# Patient Record
Sex: Female | Born: 1990 | Race: Black or African American | Hispanic: No | Marital: Single | Smoking: Never smoker
Health system: Southern US, Community
[De-identification: ages and names within clinical notes are randomized; demographics above are authoritative.]

## PROBLEM LIST (undated history)

## (undated) DIAGNOSIS — Z982 Presence of cerebrospinal fluid drainage device: Secondary | ICD-10-CM

## (undated) DIAGNOSIS — R569 Unspecified convulsions: Secondary | ICD-10-CM

## (undated) HISTORY — DX: Presence of cerebrospinal fluid drainage device: Z98.2

## (undated) HISTORY — PX: VENTRICULOPERITONEAL SHUNT: SHX204

## (undated) HISTORY — PX: APPENDECTOMY: SHX54

## (undated) HISTORY — DX: Unspecified convulsions: R56.9

---
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
## 2012-03-11 ENCOUNTER — Telehealth: Payer: Self-pay | Admitting: *Deleted

## 2012-03-11 NOTE — Telephone Encounter (Signed)
Pt left message requesting to know when she is due for her last Gardasil injection.  I returned her call and informed her that last injection is due currently and must be given by 03/27/12 for the series to have been completed properly. Pt voiced understanding and stated that she will call back to schedule appt.

## 2012-04-10 ENCOUNTER — Ambulatory Visit: Payer: BC Managed Care – PPO | Admitting: Advanced Practice Midwife

## 2016-03-11 ENCOUNTER — Encounter (HOSPITAL_BASED_OUTPATIENT_CLINIC_OR_DEPARTMENT_OTHER): Payer: Self-pay | Admitting: Emergency Medicine

## 2016-03-11 DIAGNOSIS — R1030 Lower abdominal pain, unspecified: Secondary | ICD-10-CM | POA: Insufficient documentation

## 2016-03-11 LAB — URINALYSIS, ROUTINE W REFLEX MICROSCOPIC
BILIRUBIN URINE: NEGATIVE
Glucose, UA: NEGATIVE mg/dL
HGB URINE DIPSTICK: NEGATIVE
KETONES UR: NEGATIVE mg/dL
Nitrite: NEGATIVE
PH: 6 (ref 5.0–8.0)
Protein, ur: NEGATIVE mg/dL
SPECIFIC GRAVITY, URINE: 1.027 (ref 1.005–1.030)

## 2016-03-11 LAB — PREGNANCY, URINE: Preg Test, Ur: NEGATIVE

## 2016-03-11 LAB — URINE MICROSCOPIC-ADD ON

## 2016-03-11 NOTE — ED Triage Notes (Signed)
Pt in c/o abd pressure x 1 week, mild pain. Denies vaginal bleeding but endorses discharge. Pt alert, interactive, ambulatory in NAD.

## 2016-03-12 ENCOUNTER — Emergency Department (HOSPITAL_BASED_OUTPATIENT_CLINIC_OR_DEPARTMENT_OTHER)
Admission: EM | Admit: 2016-03-12 | Discharge: 2016-03-12 | Disposition: A | Discharge status: Home / Self Care | Payer: Self-pay | Attending: Emergency Medicine | Admitting: Emergency Medicine

## 2016-03-12 DIAGNOSIS — R1030 Lower abdominal pain, unspecified: Secondary | ICD-10-CM

## 2016-03-12 LAB — WET PREP, GENITAL
Clue Cells Wet Prep HPF POC: NONE SEEN
Sperm: NONE SEEN
Trich, Wet Prep: NONE SEEN
YEAST WET PREP: NONE SEEN

## 2016-03-12 MED ORDER — METRONIDAZOLE 500 MG PO TABS
500.0000 mg | ORAL_TABLET | Freq: Once | ORAL | Status: AC
  Administered 2016-03-12: 500 mg via ORAL
  Filled 2016-03-12: qty 1

## 2016-03-12 MED ORDER — METRONIDAZOLE 500 MG PO TABS: 500.0000 mg | ORAL_TABLET | Freq: Two times a day (BID) | ORAL | 0 refills | Status: DC

## 2016-03-12 NOTE — Discharge Instructions (Signed)
You have been seen in the Emergency Department (ED) for abdominal pain.  Your evaluation did not identify a clear cause of your symptoms but was generally reassuring.  We are starting you on an antibiotic for presumed vaginal infection. Take as directed.   Please follow up as instructed above regarding today?s emergent visit and the symptoms that are bothering you.  Return to the ED if your abdominal pain worsens or fails to improve, you develop bloody vomiting, bloody diarrhea, you are unable to tolerate fluids due to vomiting, fever greater than 101, or other symptoms that concern you.

## 2016-03-12 NOTE — ED Provider Notes (Signed)
Emergency Department Provider Note   I have reviewed the triage vital signs and the nursing notes.   HISTORY  Chief Complaint Abdominal Pain   HPI Diana Henson is a 25 y.o. female with PMH of VP shunt presents to the emergency department for evaluation of lower abdominal pain worsening over the last week. She denies any obvious exacerbating or alleviating factors. She notes some associated mild vaginal discharge but no bleeding. Patient states that symptoms are worsening slightly. No associated fevers. No upper abdominal pain or back pain. No dysuria, hesitancy, urgency. She denies any associated chest pain or difficulty breathing. There are no exacerbating or alleviating factors.    Past Medical History:  Diagnosis Date  . 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 Asymptomatic bacteriuria 11/05/2011    Past Surgical History:  Procedure Laterality Date  . APPENDECTOMY    . VENTRICULOPERITONEAL SHUNT     multiple surgeries since birth    Current Outpatient Rx  . Order #: [id number] Class: Normal  . Order #: [id number] Class: Print  . Order #: [id number]: [id number] Class: Historical Med    Allergies Review of patient's allergies indicates no known allergies.  Family History  Problem Relation Age of Onset  . Diabetes Maternal Grandfather   . Hypertension Maternal Grandfather   . Kidney disease Maternal Grandfather     Social History Social History  Substance Use Topics  . Smoking status: Never Smoker  . Smokeless tobacco: Never Used  . Alcohol use No    Review of Systems  Constitutional: No fever/chills Eyes: No visual changes. ENT: No sore throat. Cardiovascular: Denies chest pain. Respiratory: Denies shortness of breath. Gastrointestinal: Positive lower abdominal pain. No nausea, no vomiting. No diarrhea. No constipation. Genitourinary: Negative for dysuria. Positive  vaginal discharge.  Musculoskeletal: Negative for back pain. Skin: Negative for rash. Neurological: Negative for headaches, focal weakness or numbness.  10-point ROS otherwise negative.  ____________________________________________   PHYSICAL EXAM:  VITAL SIGNS: ED Triage Vitals  Enc Vitals Group     BP 03/11/16 2219 124/83     Pulse Rate 03/11/16 2219 91     Resp 03/11/16 2219 18     Temp 03/11/16 2219 98.6 F (37 C)     SpO2 03/11/16 2219 99 %     Weight 03/11/16 2219 170 lb (77.1 kg)     Height 03/11/16 2219 5\' 5"  (1.651 m)     Pain Score 03/11/16 2217 6   Constitutional: Alert and oriented. Well appearing and in no acute distress. Eyes: Conjunctivae are normal.  Head: Atraumatic. Nose: No congestion/rhinnorhea. Mouth/Throat: Mucous membranes are moist.  Oropharynx non-erythematous. Neck: No stridor.  Cardiovascular: Normal rate, regular rhythm. Good peripheral circulation. Grossly normal heart sounds.   Respiratory: Normal respiratory effort.  No retractions. Lungs CTAB. Gastrointestinal: Soft and nontender. No distention.  Genitourinary: Moderate creamy vaginal discharge. No blood. Normal appearing cervix. No CMT. Mild right adnexal tenderness. No  Musculoskeletal: No lower extremity tenderness nor edema. No gross deformities of extremities. Neurologic:  Normal speech and language. No gross focal neurologic deficits are appreciated.  Skin:  Skin is warm, dry and intact. No rash noted.   ____________________________________________   LABS (all labs ordered are listed, but only abnormal results are displayed)  Labs Reviewed  WET PREP, GENITAL - Abnormal; Notable for the following:       Result Value  WBC, Wet Prep HPF POC MANY (*)    All other components within normal limits  URINALYSIS, ROUTINE W REFLEX MICROSCOPIC (NOT AT [facility]) - Abnormal; Notable for the following:    APPearance CLOUDY (*)    Leukocytes, UA SMALL (*)    All other components within normal  limits  URINE MICROSCOPIC-ADD ON - Abnormal; Notable for the following:    Squamous Epithelial / LPF 6-30 (*)    Bacteria, UA MANY (*)    All other components within normal limits  PREGNANCY, URINE  GC/CHLAMYDIA PROBE AMP ([place]) NOT AT [facility]   ____________________________________________  RADIOLOGY  None ____________________________________________   PROCEDURES  Procedure(s) performed:   Procedures  None ____________________________________________   INITIAL IMPRESSION / ASSESSMENT AND PLAN / ED COURSE  Pertinent labs & imaging results that were available during my care of the patient were reviewed by me and considered in my medical decision making (see chart for details).  Patient resents emergency department for evaluation of intermittent lower abdominal discomfort. Differential at this time is broad. Abdomen is soft and nontender anteriorly. Pelvic exam is significant for some very mild right adnexal tenderness. No cervical motion tenderness. She does have moderate amount of discharge.   Differential diagnosis includes but is not exclusive to ectopic pregnancy, ovarian cyst, ovarian torsion, acute appendicitis, urinary tract infection, endometriosis, bowel obstruction, hernia, colitis, renal colic, gastroenteritis, volvulus etc.  Equivocal UA. No clear clinical history to suggest ovarian torsion. No clinical concern for bowel obstruction or hernia. Plan for treatment with metronidazole for 7 days for presumed bacterial vaginosis and reassessment by patient's primary care physician. Also gave contact information for women's clinic in [place] for Gyn follow up.   At this time, I do not feel there is any life-threatening condition present. I have reviewed and discussed all results (EKG, imaging, lab, urine as appropriate), exam findings with patient. I have reviewed nursing notes and appropriate previous records.  I feel the patient is safe to be discharged home without  further emergent workup. Discussed usual and customary return precautions. Patient and family (if present) verbalize understanding and are comfortable with this plan.  Patient will follow-up with their primary care provider. If they do not have a primary care provider, information for follow-up has been provided to them. All questions have been answered.    ____________________________________________  FINAL CLINICAL IMPRESSION(S) / ED DIAGNOSES  Final diagnoses:  Lower abdominal pain     MEDICATIONS GIVEN DURING THIS VISIT:  Medications  metroNIDAZOLE (FLAGYL) tablet 500 mg (500 mg Oral Given 03/12/16 0417)     NEW OUTPATIENT MEDICATIONS STARTED DURING THIS VISIT:  Discharge Medication List as of 03/12/2016  3:52 AM    START taking these medications   Details  metroNIDAZOLE (FLAGYL) 500 MG tablet Take 1 tablet (500 mg total) by mouth 2 (two) times daily., Starting Sun 03/12/2016, Print          Note:  This document was prepared using Dragon voice recognition software and may include unintentional dictation errors.  Alona Bene, MD Emergency Medicine   Maia Plan, MD 03/12/16 [phone]

## 2016-03-12 NOTE — ED Notes (Signed)
Pt alert, c/o of abdominal pain for 1 week, pt denies fever, nausea and vomiting.

## 2016-03-12 NOTE — ED Notes (Signed)
Pt alert, NAD, calm, interactive, resps e/u, speaking in clear complete sentences, admits to continued pain, (denies: nausea, sob or dizziness), updated. EDP into room for pelvic exam.

## 2016-03-13 LAB — GC/CHLAMYDIA PROBE AMP (~~LOC~~) NOT AT ARMC
Chlamydia: NEGATIVE
Neisseria Gonorrhea: NEGATIVE

## 2017-01-31 ENCOUNTER — Emergency Department (HOSPITAL_BASED_OUTPATIENT_CLINIC_OR_DEPARTMENT_OTHER): Payer: Self-pay

## 2017-01-31 ENCOUNTER — Emergency Department (HOSPITAL_BASED_OUTPATIENT_CLINIC_OR_DEPARTMENT_OTHER)
Admission: EM | Admit: 2017-01-31 | Discharge: 2017-02-01 | Disposition: A | Discharge status: Home / Self Care | Payer: Self-pay | Attending: Emergency Medicine | Admitting: Emergency Medicine

## 2017-01-31 ENCOUNTER — Encounter (HOSPITAL_BASED_OUTPATIENT_CLINIC_OR_DEPARTMENT_OTHER): Payer: Self-pay

## 2017-01-31 DIAGNOSIS — N3000 Acute cystitis without hematuria: Secondary | ICD-10-CM | POA: Insufficient documentation

## 2017-01-31 DIAGNOSIS — R569 Unspecified convulsions: Secondary | ICD-10-CM | POA: Insufficient documentation

## 2017-01-31 DIAGNOSIS — Z79899 Other long term (current) drug therapy: Secondary | ICD-10-CM | POA: Insufficient documentation

## 2017-01-31 DIAGNOSIS — G40909 Epilepsy, unspecified, not intractable, without status epilepticus: Secondary | ICD-10-CM

## 2017-01-31 NOTE — ED Triage Notes (Signed)
C/o seizure x 2 this pm-hx of same-states she is compliant with meds-NAD-steady gait

## 2017-02-01 ENCOUNTER — Emergency Department (HOSPITAL_BASED_OUTPATIENT_CLINIC_OR_DEPARTMENT_OTHER): Payer: Self-pay

## 2017-02-01 ENCOUNTER — Encounter (HOSPITAL_BASED_OUTPATIENT_CLINIC_OR_DEPARTMENT_OTHER): Payer: Self-pay | Admitting: Emergency Medicine

## 2017-02-01 LAB — URINALYSIS, ROUTINE W REFLEX MICROSCOPIC
Bilirubin Urine: NEGATIVE
GLUCOSE, UA: NEGATIVE mg/dL
KETONES UR: NEGATIVE mg/dL
NITRITE: NEGATIVE
PH: 7 (ref 5.0–8.0)
Protein, ur: NEGATIVE mg/dL
Specific Gravity, Urine: <1.005 — ABNORMAL LOW (ref 1.005–1.030)

## 2017-02-01 LAB — URINALYSIS, MICROSCOPIC (REFLEX)

## 2017-02-01 LAB — BASIC METABOLIC PANEL
Anion gap: 6 (ref 5–15)
BUN: 15 mg/dL (ref 6–20)
CHLORIDE: 110 mmol/L (ref 101–111)
CO2: 21 mmol/L — AB (ref 22–32)
CREATININE: 1.02 mg/dL — AB (ref 0.44–1.00)
Calcium: 9 mg/dL (ref 8.9–10.3)
GFR calc Af Amer: >60 mL/min (ref >60)
GFR calc non Af Amer: >60 mL/min (ref >60)
GLUCOSE: 107 mg/dL — AB (ref 65–99)
Potassium: 3.6 mmol/L (ref 3.5–5.1)
SODIUM: 137 mmol/L (ref 135–145)

## 2017-02-01 LAB — PREGNANCY, URINE: Preg Test, Ur: NEGATIVE

## 2017-02-01 MED ORDER — NITROFURANTOIN MONOHYD MACRO 100 MG PO CAPS
100.0000 mg | ORAL_CAPSULE | Freq: Once | ORAL | Status: AC
  Administered 2017-02-01: 100 mg via ORAL
  Filled 2017-02-01: qty 1

## 2017-02-01 MED ORDER — NITROFURANTOIN MONOHYD MACRO 100 MG PO CAPS: 100.0000 mg | ORAL_CAPSULE | Freq: Two times a day (BID) | ORAL | 0 refills | Status: DC

## 2017-02-01 NOTE — Discharge Instructions (Signed)
No driving until cleared by neurology

## 2017-02-01 NOTE — ED Provider Notes (Addendum)
[facility]   CSN: [id number] Arrival date & time: 01/31/17  2249     History   Chief Complaint Chief Complaint  Patient presents with  . Seizures    HPI Diana Henson is a 26 y.o. female.  The history is provided by the patient.  Seizures   This is a recurrent problem. The current episode started 6 to 12 hours ago. The problem has been resolved. The most recent episode lasted less than 30 seconds. Pertinent negatives include no sleepiness, no confusion, no headaches, no speech difficulty, no visual disturbance, no neck stiffness, no sore throat, no chest pain, no cough, no nausea, no vomiting, no diarrhea and no muscle weakness. Characteristics do not include bowel incontinence or rhythmic jerking. The episode was witnessed. There was no sensation of an aura present. The seizures did not continue in the ED. The seizure(s) had no focality. Possible causes do not include missed seizure meds or recent illness. There has been no fever. There were no medications administered prior to arrival.  Patient changed from Keppra to Zonegran in Spring for her seizures.  Mom states they are brought on by stress and she lost her job today.  No LOC with her seizures no bowel or bladder incontinence.   Past Medical History:  Diagnosis Date  . 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 Asymptomatic bacteriuria 11/05/2011    Past Surgical History:  Procedure Laterality Date  . APPENDECTOMY    . VENTRICULOPERITONEAL SHUNT     multiple surgeries since birth    OB History    Gravida Para Term Preterm AB Living   0 0 0 0 0 0   SAB TAB Ectopic Multiple Live Births   0 0 0 0         Home Medications    Prior to Admission medications   Medication Sig Start Date End Date Taking? Authorizing Provider  zonisamide (ZONEGRAN) 100 MG capsule Take 100 mg by mouth daily.    Yes [provider]    Family History Family History  Problem Relation Age of Onset  . Diabetes Maternal Grandfather   . Hypertension Maternal Grandfather   . Kidney disease Maternal Grandfather     Social History Social History  Substance Use Topics  . Smoking status: Never Smoker  . Smokeless tobacco: Never Used  . Alcohol use No     Allergies   Patient has no known allergies.   Review of Systems Review of Systems  Constitutional: Negative for fever.  HENT: Negative for sore throat.   Eyes: Negative for visual disturbance.  Respiratory: Negative for cough.   Cardiovascular: Negative for chest pain.  Gastrointestinal: Negative for bowel incontinence, diarrhea, nausea and vomiting.  Genitourinary: Negative for dysuria and hematuria.  Musculoskeletal: Negative for neck pain and neck stiffness.  Skin: Negative for rash.  Neurological: Positive for seizures. Negative for dizziness, syncope, facial asymmetry, speech difficulty and headaches.  Psychiatric/Behavioral: Negative for confusion.  All other systems reviewed and are negative.    Physical Exam Updated Vital Signs BP 114/76 (BP Location: Right Arm)   Pulse 75   Temp 98.2 F (36.8 C) (Oral)   Resp 18   Ht  (1.626 m)   Wt 78.9 kg (174 lb)   LMP 01/22/2017   SpO2 100%   BMI 29.87 kg/m  Physical Exam  Constitutional: She is oriented to person, place, and time. She appears well-developed and well-nourished. No distress.  HENT:  Head: Normocephalic and atraumatic.  Right Ear: External ear normal.  Left Ear: External ear normal.  Nose: Nose normal.  Mouth/Throat: Oropharynx is clear and moist. No oropharyngeal exudate.  Eyes: Pupils are equal, round, and reactive to light. Conjunctivae and EOM are normal.  Neck: Normal range of motion. Neck supple.  Cardiovascular: Normal rate, regular rhythm, normal heart sounds and intact distal pulses.   Pulmonary/Chest: Effort normal and breath sounds  normal. No stridor. She has no wheezes. She has no rales.  Abdominal: Soft. Bowel sounds are normal. She exhibits no mass. There is no tenderness. There is no rebound and no guarding.  Musculoskeletal: Normal range of motion. She exhibits no deformity.  Lymphadenopathy:    She has no cervical adenopathy.  Neurological: She is alert and oriented to person, place, and time. No cranial nerve deficit. She exhibits normal muscle tone. Coordination normal.  Skin: Skin is warm. Capillary refill takes less than 2 seconds.  Psychiatric: She has a normal mood and affect.     ED Treatments / Results   Vitals:   01/31/17 2258 02/01/17 0110  BP: 128/77 114/76  Pulse: 91 75  Resp: 16 18  Temp: 98.2 F (36.8 C)   SpO2: 100% 100%      Radiology Dg Skull 1-3 Views  Result Date: 02/01/2017 CLINICAL DATA:  Seizure x2 tonight in patient with a ventriculostomy shunt catheter. Patient status post catheter revision in February, 2014 EXAM: SKULL - 1-3 VIEW COMPARISON:  None. FINDINGS: There is no evidence of skull fracture or other focal bone lesions. Ventriculostomy shunt catheter from a right parietal approach is intact. IMPRESSION: Intact shunt catheter. Electronically Signed   By: Drusilla Kannerhomas  Dalessio M.D.   On: 02/01/2017 02:17   Dg Neck Soft Tissue  Result Date: 02/01/2017 CLINICAL DATA:  Status post seizure times two tonight in patient with a ventriculostomy shunt catheter. EXAM: NECK SOFT TISSUES - 1+ VIEW COMPARISON:  None. FINDINGS: Ventriculostomy shunt catheter is intact. The cervical airway is unremarkable and no radio-opaque foreign body identified. IMPRESSION: Intact shunt catheter.  Negative exam. Electronically Signed   By: Drusilla Kannerhomas  Dalessio M.D.   On: 02/01/2017 02:21   Dg Chest 1 View  Result Date: 02/01/2017 CLINICAL DATA:  Status post seizure times two tonight in patient with a ventriculostomy shunt catheter. EXAM: CHEST 1 VIEW COMPARISON:  PA and lateral chest 12/07/2009. FINDINGS: Shunt  catheter coursing on the right side of the chest is intact. Lungs are clear. Heart size is normal. No pneumothorax or pleural effusion. No bony abnormality. IMPRESSION: Intact shunt catheter.  No acute abnormality. Electronically Signed   By: Drusilla Kannerhomas  Dalessio M.D.   On: 02/01/2017 02:19   Dg Abd 1 View  Result Date: 02/01/2017 CLINICAL DATA:  Status post seizure times two tonight in patient with a ventriculostomy shunt catheter. EXAM: ABDOMEN - 1 VIEW COMPARISON:  None. FINDINGS: Ventriculostomy shunt catheter is intact with its tip in the left pelvis. The bowel gas pattern is normal. No radio-opaque calculi or other significant radiographic abnormality are seen. IMPRESSION: Intact shunt catheter.  Negative exam. Electronically Signed   By: Drusilla Kannerhomas  Dalessio M.D.   On: 02/01/2017 02:20   Ct Head Wo Contrast  Result Date: 01/31/2017 CLINICAL DATA:  Seizures x2 this evening. EXAM: CT HEAD WITHOUT CONTRAST TECHNIQUE: Contiguous axial images were obtained from the base of the skull through the  vertex without intravenous contrast. COMPARISON:  CT report from 10/11/2011 FINDINGS: Brain: No evidence of acute infarction, hemorrhage, hydrocephalus, extra-axial collection or mass lesion/mass effect. Vascular: No hyperdense vessel or unexpected calcification. Skull: Right-sided ventriculostomy is noted with tip in the region of the right frontal horn. No skull fracture. Sinuses/Orbits: No acute finding. Other: None IMPRESSION: No acute intracranial abnormality. Electronically Signed   By: Tollie Eth M.D.   On: 01/31/2017 23:32    Procedures Procedures (including critical care time)  Medications Ordered in ED Medications  nitrofurantoin (macrocrystal-monohydrate) (MACROBID) capsule 100 mg (not administered)      Final Clinical Impressions(s) / ED Diagnoses  Records from Neurologist reviewed.  Patient does have a seizure disorder.  Will treat for UTI.  Call your neurologist in the am to discuss dosage of  your zonegran or adding a second medication for your seizures.  No shunt malfunction.  Well appearing.  Stable for discharge with strict return precautions.  No driving until cleared by neurology.  The patient is very well appearing and has been observed in the ED.  Strict return precautions given for  chest pain, dyspnea on exertion, new weakness or numbness changes in vision or speech,  Inability to tolerate liquids or food, changes in voice cough, altered mental status or any concerns. No signs of systemic illness or infection. Return for recurrent seizures.  The patient is nontoxic-appearing on exam and vital signs are within normal limits.    I have reviewed the triage vital signs and the nursing notes. Pertinent labs &imaging results that were available during my care of the patient were reviewed by me and considered in my medical decision making (see chart for details).  After history, exam, and medical workup I feel the patient has been appropriately medically screened and is safe for discharge home. Pertinent diagnoses were discussed with the patient. Patient was given return precautions.       Toneisha Savary, MD 02/01/17 1610    Cy Blamer, MD 02/01/17 [id number]

## 2018-12-09 ENCOUNTER — Ambulatory Visit (INDEPENDENT_AMBULATORY_CARE_PROVIDER_SITE_OTHER): Payer: 59 | Admitting: Pediatrics

## 2018-12-09 ENCOUNTER — Other Ambulatory Visit: Payer: Self-pay

## 2018-12-09 ENCOUNTER — Encounter: Payer: Self-pay | Admitting: Pediatrics

## 2018-12-09 VITALS — BP 108/64 | HR 90 | Temp 98.2°F | Resp 16 | Ht 65.0 in | Wt 188.0 lb

## 2018-12-09 DIAGNOSIS — L500 Allergic urticaria: Secondary | ICD-10-CM

## 2018-12-09 DIAGNOSIS — J301 Allergic rhinitis due to pollen: Secondary | ICD-10-CM | POA: Diagnosis not present

## 2018-12-09 DIAGNOSIS — J4530 Mild persistent asthma, uncomplicated: Secondary | ICD-10-CM | POA: Diagnosis not present

## 2018-12-09 DIAGNOSIS — Z8669 Personal history of other diseases of the nervous system and sense organs: Secondary | ICD-10-CM

## 2018-12-09 DIAGNOSIS — Z982 Presence of cerebrospinal fluid drainage device: Secondary | ICD-10-CM

## 2018-12-09 MED ORDER — FLUTICASONE PROPIONATE 50 MCG/ACT NA SUSP: 2.0000 | Freq: Every day | NASAL | 5 refills | Status: DC | PRN

## 2018-12-09 MED ORDER — MONTELUKAST SODIUM 10 MG PO TABS: ORAL_TABLET | ORAL | 5 refills | Status: DC

## 2018-12-09 MED ORDER — ALBUTEROL SULFATE HFA 108 (90 BASE) MCG/ACT IN AERS: 2.0000 | INHALATION_SPRAY | RESPIRATORY_TRACT | 2 refills | Status: DC | PRN

## 2018-12-09 NOTE — Patient Instructions (Addendum)
   Environmental control of dust mite and mold Zyrtec 10 mg-take 1 tablet once a day for runny nose, itchy eyes or itching Fluticasone 2 sprays per nostril once a day if needed for stuffy nose Opcon-A-1 drop 3 times a day if needed for itchy eyes  Montelukast 10 mg-take 1 tablet once a day to prevent coughing or wheezing or tightness in the chest Pro-air 2 puffs every 4 hours if needed for wheezing or coughing spells.  You may use Pro-air 2 puffs 5 to 15 minutes before exercise Add prednisone 10 mg tablets to take 2 tablets twice a day for 3 days, 2 tablets on the fourth day, 1 tablet on the fifth day to bring your allergic symptoms under control Call us if you are not doing well on this treatment plan  Avoid fresh apples and strawberries.  I gave you a list of foods associated with oral pollinosis syndrome

## 2018-12-09 NOTE — Progress Notes (Addendum)
[address] [place] [address] Dept: [phone]  New Patient Note  Patient ID: Diana Henson, female    DOB: January 14, 1991  Age: 28 y.o. MRN: [id number] Date of Office Visit: 12/09/2018 Referring provider: No referring provider defined for this encounter.    Chief Complaint: Nasal Congestion, Allergic Rhinitis , and Pruritis  HPI [name] presents for evaluation of seasonal allergic rhinitis since childhood.  During the past 6 weeks she has had some tightness in the chest.  She has aggravation of her symptoms of sneezing and nasal congestion on exposure to dust, cigarette smoke, possibly cats and dogs and seasonal changes.  She does not have a history of asthma, eczema or gastroesophageal reflux.  She has had some itching of her skin for 3 months  Review of Systems  Constitutional: Negative.   HENT:       Seasonal allergic rhinitis since childhood  Eyes:       Itchy eyes at times  Respiratory:       Tightness in the chest for 6 weeks  Cardiovascular: Negative.   Gastrointestinal: Negative.   Genitourinary: Negative.   Musculoskeletal: Negative.   Skin:       Itchy skin for 3 months.  No actual hives  Neurological:       Migraine headaches.  VP shunt for hydrocephalus  Endo/Heme/Allergies:       No diabetes or thyroid disease  Psychiatric/Behavioral: Negative.     Outpatient Encounter Medications as of 12/09/2018  Medication Sig  . EMGALITY 120 MG/ML SOSY ADMINISTER 1 ML UNDER THE SKIN EVERY 30 DAYS  . zonisamide (ZONEGRAN) 100 MG capsule Take 200 mg by mouth daily.  Marland Kitchen albuterol (PROAIR HFA) 108 (90 Base) MCG/ACT inhaler Inhale 2 puffs into the lungs every 4 (four) hours as needed for wheezing or shortness of breath.  . fluticasone (FLONASE) 50 MCG/ACT nasal spray Place 2 sprays into both nostrils daily as needed (for stuffy nose).  . montelukast (SINGULAIR) 10 MG tablet Take 1 tablet once a day to prevent coughing or wheezing  . [DISCONTINUED]  nitrofurantoin, macrocrystal-monohydrate, (MACROBID) 100 MG capsule Take 1 capsule (100 mg total) by mouth 2 (two) times daily. X 7 days (Patient not taking: Reported on 12/09/2018)  . [DISCONTINUED] zonisamide (ZONEGRAN) 100 MG capsule Take 100 mg by mouth daily.  . [DISCONTINUED] medroxyPROGESTERone (DEPO-PROVERA) injection 150 mg    No facility-administered encounter medications on file as of 12/09/2018.      Drug Allergies:  No Known Allergies  Family History: Halie's family history includes Anemia in her mother and sister; Crohn's disease in her sister; Diabetes in her maternal grandfather; Hypertension in her maternal grandfather; Kidney disease in her maternal grandfather..  Family history is positive for asthma, hayfever, sinus problems, eczema, food allergies.  Family history is negative for angioedema, chronic urticaria lupus, chronic bronchitis or emphysema.  Social and environmental.  She is not exposed to cigarette smoking.  She has not smoked cigarettes in the past.. There are no pets in her home.  She visits her aunt who has 2 dogs.  She works doing Insurance underwriter work indoors  Physical Exam: BP 108/64 (BP Location: Left Arm, Patient Position: Sitting, Cuff Size: Normal)   Pulse 90   Temp 98.2 F (36.8 C) (Temporal)   Resp 16   Ht 5\' 5"  (1.651 m)   Wt 188 lb (85.3 kg)   SpO2 97%   BMI 31.28 kg/m    Physical Exam Constitutional:  Appearance: Normal appearance. She is obese.  HENT:     Head:     Comments: Eyes normal.  Ears normal.  Nose mild swelling of the nasal turbinates with a clear nasal discharge.  Pharynx normal. Neck:     Musculoskeletal: Neck supple.     Comments: No thyromegaly Cardiovascular:     Comments: S1-S2 normal no murmurs Pulmonary:     Comments: Clear to percussion and auscultation Abdominal:     Palpations: Abdomen is soft.     Tenderness: There is no abdominal tenderness.     Comments: No hepatosplenomegaly  Lymphadenopathy:     Cervical:  No cervical adenopathy.  Skin:    Comments: Clear  Neurological:     General: No focal deficit present.     Mental Status: She is alert and oriented to person, place, and time. Mental status is at baseline.  Psychiatric:        Mood and Affect: Mood normal.        Behavior: Behavior normal.        Thought Content: Thought content normal.        Judgment: Judgment normal.     Diagnostics: FVC 0.80 L FEV1 3.18 L.  Predicted FVC 3.36 L predicted FEV1 2.90 L.  After albuterol 2 puffs FVC 4.20 L FEV1 3.25 L-the spirometry is in the normal range and there was no significant improvement after albuterol  Allergy skin test were very positive to grass pollens, weed pollen, tree pollens, molds, cat.  There was some reactivity to dog, cockroach and dust mite.  Skin testing to foods was negative   Assessment  Assessment and Plan: 1. Seasonal allergic rhinitis due to pollen   2. Allergic urticaria   3. Mild persistent reactive airway disease without complication   4. History of migraine headaches   5. S/P VP shunt   6. History of hydrocephalus     Meds ordered this encounter  Medications  . fluticasone (FLONASE) 50 MCG/ACT nasal spray    Sig: Place 2 sprays into both nostrils daily as needed (for stuffy nose).    Dispense:  16 g    Refill:  5  . montelukast (SINGULAIR) 10 MG tablet    Sig: Take 1 tablet once a day to prevent coughing or wheezing    Dispense:  30 tablet    Refill:  5  . albuterol (PROAIR HFA) 108 (90 Base) MCG/ACT inhaler    Sig: Inhale 2 puffs into the lungs every 4 (four) hours as needed for wheezing or shortness of breath.    Dispense:  18 g    Refill:  2    Patient Instructions    Environmental control of dust mite and mold Zyrtec 10 mg-take 1 tablet once a day for runny nose, itchy eyes or itching Fluticasone 2 sprays per nostril once a day if needed for stuffy nose Opcon-A-1 drop 3 times a day if needed for itchy eyes  Montelukast 10 mg-take 1 tablet once  a day to prevent coughing or wheezing or tightness in the chest Pro-air 2 puffs every 4 hours if needed for wheezing or coughing spells.  You may use Pro-air 2 puffs 5 to 15 minutes before exercise Add prednisone 10 mg tablets to take 2 tablets twice a day for 3 days, 2 tablets on the fourth day, 1 tablet on the fifth day to bring your allergic symptoms under control Call us if you are not doing well on this treatment plan  Avoid fresh  apples and strawberries.  I gave you a list of foods associated with oral pollinosis syndrome    Return in about 4 weeks (around 01/06/2019).   Thank you for the opportunity to care for this patient.  Please do not hesitate to contact me with questions.  Tonette BihariJ. A. Samyah Bilbo, M.D.  [facility] [address] [place], [place] [address] [phone]

## 2019-01-06 ENCOUNTER — Encounter: Payer: Self-pay | Admitting: Pediatrics

## 2019-01-06 ENCOUNTER — Other Ambulatory Visit: Payer: Self-pay

## 2019-01-06 ENCOUNTER — Ambulatory Visit (INDEPENDENT_AMBULATORY_CARE_PROVIDER_SITE_OTHER): Payer: 59 | Admitting: Pediatrics

## 2019-01-06 VITALS — BP 118/60 | HR 78 | Temp 98.2°F | Resp 16

## 2019-01-06 DIAGNOSIS — J4530 Mild persistent asthma, uncomplicated: Secondary | ICD-10-CM

## 2019-01-06 DIAGNOSIS — Z982 Presence of cerebrospinal fluid drainage device: Secondary | ICD-10-CM

## 2019-01-06 DIAGNOSIS — J301 Allergic rhinitis due to pollen: Secondary | ICD-10-CM | POA: Diagnosis not present

## 2019-01-06 DIAGNOSIS — Z8669 Personal history of other diseases of the nervous system and sense organs: Secondary | ICD-10-CM

## 2019-01-06 DIAGNOSIS — L500 Allergic urticaria: Secondary | ICD-10-CM | POA: Diagnosis not present

## 2019-01-06 MED ORDER — MONTELUKAST SODIUM 10 MG PO TABS: ORAL_TABLET | ORAL | 5 refills | Status: DC

## 2019-01-06 NOTE — Patient Instructions (Addendum)
Zyrtec 10 mg-take 1 tablet once a day for runny nose, itchy eyes or itching Fluticasone 2 sprays per nostril once a day if needed for stuffy nose\ Opcon-A-1 drop 3 times a day if needed for itchy eyes  Montelukast 10 mg-take 1 tablet once a day to prevent coughing , wheezing or tightness in the chest Pro-air 2 puffs every 4 hours if needed for wheezing or coughing spells.  You may use Pro-air 2 puffs 5 to 15 minutes before exercise  Continue on your other medications Call us if you are not doing well on this treatment plan  Avoid fresh apples and strawberries.  I gave you a list of foods associated with oral pollinosis syndrome

## 2019-01-06 NOTE — Progress Notes (Addendum)
[address] [place] [address] Dept: [phone]  FOLLOW UP NOTE  Patient ID: Diana Henson, female    DOB: 08/08/1990  Age: 28 y.o. MRN: [id number] Date of Office Visit: 01/06/2019  Assessment  Chief Complaint: Allergic Rhinitis  (doing well), Breathing Problem (pt. states her breathing is doing much better.), and Urticaria (doing much better. Pt. states Diana Henson hasn't noticed any hives since last visit.)  HPI Diana Henson presents for follow-up of reactive airways, urticaria and nasal congestion.  Diana Henson has not had any itching since the last visit.  Diana Henson continues to avoid fresh apples and strawberries.  Diana Henson is on Zyrtec 10 mg once a day.  Diana Henson is not having any coughing or shortness of breath with the use of montelukast 10 mg once a day and Pro-air 2 puffs every 4 hours if needed   Drug Allergies:  No Known Allergies  Physical Exam: BP 118/60 (BP Location: Right Arm, Patient Position: Sitting, Cuff Size: Normal)   Pulse 78   Temp 98.2 F (36.8 C) (Oral)   Resp 16   SpO2 99%    Physical Exam Constitutional:      Appearance: Normal appearance. Diana Henson is normal weight.  HENT:     Head:     Comments: Eyes normal.  Ears normal.  Nose normal.  Pharynx normal. Neck:     Musculoskeletal: Neck supple.  Cardiovascular:     Comments: S1-S2 normal no murmurs Pulmonary:     Comments: Clear to percussion and auscultation Lymphadenopathy:     Cervical: No cervical adenopathy.  Neurological:     General: No focal deficit present.     Mental Status: Diana Henson is alert and oriented to person, place, and time. Mental status is at baseline.  Psychiatric:        Mood and Affect: Mood normal.        Behavior: Behavior normal.        Thought Content: Thought content normal.        Judgment: Judgment normal.     Diagnostics: FVC 4.13 L FEV1 3.11 L.  Predicted FVC 3.36 L predicted FEV1 2.90 L-the spirometry is in the normal range  Assessment and Plan: 1. Mild persistent asthma  without complication   2. Allergic urticaria   3. Seasonal allergic rhinitis due to pollen   4. S/P VP shunt   5. History of migraine headaches     Meds ordered this encounter  Medications  . montelukast (SINGULAIR) 10 MG tablet    Sig: Take 1 tablet once a day to prevent coughing or wheezing    Dispense:  34 tablet    Refill:  5    Please keep rx on file. Pt. Will call when needed.    Patient Instructions  Zyrtec 10 mg-take 1 tablet once a day for runny nose, itchy eyes or itching Fluticasone 2 sprays per nostril once a day if needed for stuffy nose\A-1 drop 3 times a day if needed for itchy eyes  Montelukast 10 mg-take 1 tablet once a day to prevent coughing , wheezing or tightness in the chest Pro-air 2 puffs every 4 hours if needed for wheezing or coughing spells.  You may use Pro-air 2 puffs 5 to 15 minutes before exercise  Continue on your other medications Call us if you are not doing well on this treatment plan  Avoid fresh apples and strawberries.  I gave you a list of foods associated with oral pollinosis syndrome   Return in about  6 months (around 07/09/2019).    Thank you for the opportunity to care for this patient.  Please do not hesitate to contact me with questions.  Tonette BihariJ. A. Bardelas, M.D.  [facility] [address] [place], [place] [address] [phone]

## 2019-03-02 ENCOUNTER — Other Ambulatory Visit: Payer: Self-pay | Admitting: Pediatrics

## 2019-05-06 ENCOUNTER — Emergency Department (HOSPITAL_BASED_OUTPATIENT_CLINIC_OR_DEPARTMENT_OTHER)
Admission: EM | Admit: 2019-05-06 | Discharge: 2019-05-06 | Disposition: A | Discharge status: Home / Self Care | Payer: 59 | Attending: Emergency Medicine | Admitting: Emergency Medicine

## 2019-05-06 ENCOUNTER — Encounter (HOSPITAL_BASED_OUTPATIENT_CLINIC_OR_DEPARTMENT_OTHER): Payer: Self-pay | Admitting: Emergency Medicine

## 2019-05-06 ENCOUNTER — Other Ambulatory Visit: Payer: Self-pay

## 2019-05-06 ENCOUNTER — Emergency Department (HOSPITAL_BASED_OUTPATIENT_CLINIC_OR_DEPARTMENT_OTHER): Payer: 59

## 2019-05-06 DIAGNOSIS — Z79899 Other long term (current) drug therapy: Secondary | ICD-10-CM | POA: Diagnosis not present

## 2019-05-06 DIAGNOSIS — R0789 Other chest pain: Secondary | ICD-10-CM

## 2019-05-06 LAB — CBC WITH DIFFERENTIAL/PLATELET
Abs Immature Granulocytes: 0.02 10*3/uL (ref 0.00–0.07)
Basophils Absolute: 0 10*3/uL (ref 0.0–0.1)
Basophils Relative: 0 %
Eosinophils Absolute: 0 10*3/uL (ref 0.0–0.5)
Eosinophils Relative: 0 %
HCT: 40.7 % (ref 36.0–46.0)
Hemoglobin: 13.2 g/dL (ref 12.0–15.0)
Immature Granulocytes: 0 %
Lymphocytes Relative: 28 %
Lymphs Abs: 1.3 10*3/uL (ref 0.7–4.0)
MCH: 27.4 pg (ref 26.0–34.0)
MCHC: 32.4 g/dL (ref 30.0–36.0)
MCV: 84.4 fL (ref 80.0–100.0)
Monocytes Absolute: 0.4 10*3/uL (ref 0.1–1.0)
Monocytes Relative: 8 %
Neutro Abs: 3 10*3/uL (ref 1.7–7.7)
Neutrophils Relative %: 64 %
Platelets: 254 10*3/uL (ref 150–400)
RBC: 4.82 MIL/uL (ref 3.87–5.11)
RDW: 13.3 % (ref 11.5–15.5)
WBC: 4.7 10*3/uL (ref 4.0–10.5)
nRBC: 0 % (ref 0.0–0.2)

## 2019-05-06 LAB — BASIC METABOLIC PANEL
Anion gap: 7 (ref 5–15)
BUN: 13 mg/dL (ref 6–20)
CO2: 19 mmol/L — ABNORMAL LOW (ref 22–32)
Calcium: 9 mg/dL (ref 8.9–10.3)
Chloride: 113 mmol/L — ABNORMAL HIGH (ref 98–111)
Creatinine, Ser: 1.08 mg/dL — ABNORMAL HIGH (ref 0.44–1.00)
GFR calc Af Amer: >60 mL/min (ref >60)
GFR calc non Af Amer: >60 mL/min (ref >60)
Glucose, Bld: 97 mg/dL (ref 70–99)
Potassium: 3.9 mmol/L (ref 3.5–5.1)
Sodium: 139 mmol/L (ref 135–145)

## 2019-05-06 LAB — URINALYSIS, ROUTINE W REFLEX MICROSCOPIC
Bilirubin Urine: NEGATIVE
Glucose, UA: NEGATIVE mg/dL
Ketones, ur: NEGATIVE mg/dL
Nitrite: NEGATIVE
Protein, ur: NEGATIVE mg/dL
Specific Gravity, Urine: 1.02 (ref 1.005–1.030)
pH: 6 (ref 5.0–8.0)

## 2019-05-06 LAB — URINALYSIS, MICROSCOPIC (REFLEX)

## 2019-05-06 LAB — PREGNANCY, URINE: Preg Test, Ur: NEGATIVE

## 2019-05-06 LAB — TROPONIN I (HIGH SENSITIVITY): Troponin I (High Sensitivity): <2 ng/L (ref <18)

## 2019-05-06 MED ORDER — NAPROXEN 500 MG PO TABS: 500.0000 mg | ORAL_TABLET | Freq: Two times a day (BID) | ORAL | 0 refills | Status: DC

## 2019-05-06 NOTE — ED Notes (Signed)
Pt on monitor 

## 2019-05-06 NOTE — ED Triage Notes (Signed)
Thoracic back pain radiating into her chest with mild intermittent SOB. Denies recent illness.

## 2019-05-06 NOTE — Discharge Instructions (Addendum)
Based on review of vitals, medical screening exam, lab work and/or imaging, there does not appear to be an acute, emergent etiology for the patient's symptoms. Counseled pt on good return precautions and encouraged both PCP and ED follow-up as needed.  Prior to discharge, I also discussed incidental imaging findings with patient in detail and advised appropriate, recommended follow-up in detail.  Clinical Impression: 1. Atypical chest pain     Disposition: Discharge  Prior to providing a prescription for a controlled substance, I independently reviewed the patient's recent prescription history on the [place]. The patient had no recent or regular prescriptions and was deemed appropriate for a brief, less than 3 day prescription of narcotic for acute analgesia.  This note was prepared with assistance of Systems analyst. Occasional wrong-word or sound-a-like substitutions may have occurred due to the inherent limitations of voice recognition software.

## 2019-05-06 NOTE — ED Provider Notes (Addendum)
[facility] [place] EMERGENCY DEPARTMENT Provider Note   CSN: [id number] Arrival date & time: 05/06/19  5374     History Chief Complaint  Patient presents with  . Back Pain  . Chest Pain    Diana Henson is a 28 y.o. female.  A 27 year old patient with a history of obesity presents for evaluation of chest pain. Initial onset of pain was more than 6 hours ago. The patient's chest pain is not worse with exertion. The patient's chest pain is middle- or left-sided, is not well-localized, is not described as heaviness/pressure/tightness, is not sharp and does not radiate to the arms/jaw/neck. It does radiate to her back. The patient does not complain of nausea and denies diaphoresis. The patient has no history of stroke, has no history of peripheral artery disease, has not smoked in the past 90 days, denies any history of treated diabetes, has no relevant family history of coronary artery disease (first degree relative at less than age 88), is not hypertensive and has no history of hypercholesterolemia. Denies legs swelling, hormone use.        Past Medical History:  Diagnosis Date  . 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 Asymptomatic bacteriuria 11/05/2011    Past Surgical History:  Procedure Laterality Date  . APPENDECTOMY    . VENTRICULOPERITONEAL SHUNT     multiple surgeries since birth     OB History    Gravida  0   Para  0   Term  0   Preterm  0   AB  0   Living  0     SAB  0   TAB  0   Ectopic  0   Multiple  0   Live Births              Family History  Problem Relation Age of Onset  . Anemia Mother   . Anemia Sister   . Crohn's disease Sister   . Diabetes Maternal Grandfather   . Hypertension Maternal Grandfather   . Kidney disease Maternal Grandfather   . Urticaria Neg Hx   . Immunodeficiency Neg Hx   . Eczema Neg Hx   . Atopy  Neg Hx   . Asthma Neg Hx   . Angioedema Neg Hx   . Allergic rhinitis Neg Hx     Social History   Tobacco Use  . Smoking status: Never Smoker  . Smokeless tobacco: Never Used  Substance Use Topics  . Alcohol use: No  . Drug use: No    Home Medications Prior to Admission medications   Medication Sig Start Date End Date Taking? Authorizing Provider  albuterol (VENTOLIN HFA) 108 (90 Base) MCG/ACT inhaler INHALE 2 PUFFS INTO THE LUNGS EVERY 4 (FOUR) HOURS AS NEEDED FOR WHEEZING OR SHORTNESS OF BREATH. 03/03/19   Fletcher Anon, MD  butalbital-acetaminophen-caffeine (FIORICET) 50-325-40 MG tablet Take 1 tablet and can repeat once in 1hr but no more than 2 tablets in 24hrs. No more than 2 days/week. 12/31/18   [provider]  clotrimazole-betamethasone (LOTRISONE) cream  01/02/19   [provider]  EMGALITY 120 MG/ML SOSY ADMINISTER 1 ML UNDER THE SKIN EVERY 30 DAYS 07/16/18   [provider]  fluticasone (FLONASE) 50 MCG/ACT nasal spray Place 2 sprays into both nostrils daily as needed (for stuffy nose). 12/09/18   Bardelas,  Jose A, MD  montelukast (SINGULAIR) 10 MG tablet Take 1 tablet once a day to prevent coughing or wheezing 01/06/19   Fletcher AnonBardelas, Jose A, MD  naproxen (NAPROSYN) 500 MG tablet Take 1 tablet (500 mg total) by mouth 2 (two) times daily. 05/06/19   Arlyn DunningMcLean, Jnaya Butrick A, PA-C  pantoprazole (PROTONIX) 40 MG tablet Take by mouth. 12/20/18   [provider]  triamcinolone ointment (KENALOG) 0.5 % APPLY TO RASH TWICE DAILY FOR 2 WEEKS. 03/12/19   [provider]  zonisamide (ZONEGRAN) 100 MG capsule Take 200 mg by mouth daily.    [provider]    Allergies    Patient has no known allergies.  Review of Systems   Review of Systems  Constitutional: Negative for chills and fever.  HENT: Negative for congestion.   Respiratory: Negative for cough and shortness of breath.   Cardiovascular: Positive for chest pain.  Gastrointestinal:  Negative for nausea and vomiting.  Genitourinary: Negative for dysuria.  Skin: Negative for rash.  Neurological: Negative for dizziness, light-headedness and headaches.    Physical Exam Updated Vital Signs BP 112/77   Pulse 66   Temp 99.3 F (37.4 C) (Oral)   Resp 18   Ht 5\' 5"  (1.651 m)   Wt 85.7 kg   LMP 04/21/2019   SpO2 100%   BMI 31.45 kg/m   Physical Exam Vitals and nursing note reviewed.  Constitutional:      General: She is not in acute distress.    Appearance: Normal appearance. She is well-developed. She is obese. She is not ill-appearing, toxic-appearing or diaphoretic.  HENT:     Head: Normocephalic.  Eyes:     Conjunctiva/sclera: Conjunctivae normal.  Cardiovascular:     Rate and Rhythm: Normal rate and regular rhythm.  Pulmonary:     Effort: Pulmonary effort is normal.     Breath sounds: Normal breath sounds.  Chest:     Chest wall: Tenderness present.  Abdominal:     Palpations: Abdomen is soft.     Tenderness: There is no abdominal tenderness. There is no guarding.  Musculoskeletal:     Right lower leg: No edema.     Left lower leg: No edema.  Skin:    General: Skin is dry.  Neurological:     Mental Status: She is alert.  Psychiatric:        Mood and Affect: Mood normal.     ED Results / Procedures / Treatments   Labs (all labs ordered are listed, but only abnormal results are displayed) Labs Reviewed  URINALYSIS, ROUTINE W REFLEX MICROSCOPIC - Abnormal; Notable for the following components:      Result Value   Hgb urine dipstick TRACE (*)    Leukocytes,Ua SMALL (*)    All other components within normal limits  BASIC METABOLIC PANEL - Abnormal; Notable for the following components:   Chloride 113 (*)    CO2 19 (*)    Creatinine, Ser 1.08 (*)    All other components within normal limits  URINALYSIS, MICROSCOPIC (REFLEX) - Abnormal; Notable for the following components:   Bacteria, UA MANY (*)    All other components within normal  limits  PREGNANCY, URINE  CBC WITH DIFFERENTIAL/PLATELET  TROPONIN I (HIGH SENSITIVITY)    EKG EKG Interpretation  Date/Time:  Tuesday May 06 2019 09:15:43 EST Ventricular Rate:  87 PR Interval:    QRS Duration: 89 QT Interval:  370 QTC Calculation: 446 R Axis:   86 Text Interpretation: Sinus  rhythm Borderline Q waves in lateral leads Abnrm T, consider ischemia, anterolateral lds No previous ECGs available Confirmed by Theotis Burrow [phone]) on 05/06/2019 9:35:30 AM   Radiology No results found.  Procedures Procedures (including critical care time)  Medications Ordered in ED Medications - No data to display  ED Course  I have reviewed the triage vital signs and the nursing notes.  Pertinent labs & imaging results that were available during my care of the patient were reviewed by me and considered in my medical decision making (see chart for details).  Clinical Course as of May 12 2219  Tue May 06, 2019  1039 Patient presenting with chest pain consistent since yesterday, worse with deep breath and movement. Heart score 2, trop <2, PERC negative. Labs, EKG, chest xray unremarkable. Doubt acs. She does have hx of asymptomatic bacteruria and is not haivng urinary symptoms so I will not treat patient's urine today. Patient advised to f/u with PMD. Start naproxen   [KM]    Clinical Course User Index [KM] Kristine Royal   MDM Rules/Calculators/A&P                      Based on review of vitals, medical screening exam, lab work and/or imaging, there does not appear to be an acute, emergent etiology for the patient's symptoms. Counseled pt on good return precautions and encouraged both PCP and ED follow-up as needed.  Prior to discharge, I also discussed incidental imaging findings with patient in detail and advised appropriate, recommended follow-up in detail.  Clinical Impression: 1. Atypical chest pain     Disposition: Discharge  Prior to providing a  prescription for a controlled substance, I independently reviewed the patient's recent prescription history on the [place]. The patient had no recent or regular prescriptions and was deemed appropriate for a brief, less than 3 day prescription of narcotic for acute analgesia.  This note was prepared with assistance of Systems analyst. Occasional wrong-word or sound-a-like substitutions may have occurred due to the inherent limitations of voice recognition software.  Final Clinical Impression(s) / ED Diagnoses Final diagnoses:  Atypical chest pain    Rx / DC Orders ED Discharge Orders         Ordered    naproxen (NAPROSYN) 500 MG tablet  2 times daily     05/06/19 1046           Kristine Royal 05/06/19 1047    Little, Wenda Overland, MD 05/07/19 1003    Alveria Apley, PA-C 05/12/19 2221    Alveria Apley, PA-C 05/12/19 2223    Little, Wenda Overland, MD 05/12/19 2250

## 2019-05-06 NOTE — ED Notes (Signed)
ED Provider at bedside. 

## 2019-07-15 ENCOUNTER — Ambulatory Visit: Payer: 59 | Admitting: Pediatrics

## 2019-08-15 ENCOUNTER — Other Ambulatory Visit: Payer: Self-pay

## 2019-08-19 ENCOUNTER — Other Ambulatory Visit: Payer: Self-pay

## 2019-09-20 ENCOUNTER — Emergency Department (HOSPITAL_COMMUNITY): Payer: Managed Care, Other (non HMO)

## 2019-09-20 ENCOUNTER — Emergency Department (HOSPITAL_COMMUNITY)
Admission: EM | Admit: 2019-09-20 | Discharge: 2019-09-20 | Disposition: A | Discharge status: Home / Self Care | Payer: Managed Care, Other (non HMO) | Attending: Emergency Medicine | Admitting: Emergency Medicine

## 2019-09-20 ENCOUNTER — Other Ambulatory Visit: Payer: Self-pay

## 2019-09-20 DIAGNOSIS — W1830XA Fall on same level, unspecified, initial encounter: Secondary | ICD-10-CM | POA: Diagnosis not present

## 2019-09-20 DIAGNOSIS — T07XXXA Unspecified multiple injuries, initial encounter: Secondary | ICD-10-CM | POA: Diagnosis present

## 2019-09-20 DIAGNOSIS — R569 Unspecified convulsions: Secondary | ICD-10-CM

## 2019-09-20 DIAGNOSIS — R519 Headache, unspecified: Secondary | ICD-10-CM | POA: Diagnosis not present

## 2019-09-20 DIAGNOSIS — S0181XA Laceration without foreign body of other part of head, initial encounter: Secondary | ICD-10-CM

## 2019-09-20 DIAGNOSIS — Z79899 Other long term (current) drug therapy: Secondary | ICD-10-CM | POA: Insufficient documentation

## 2019-09-20 DIAGNOSIS — S0083XA Contusion of other part of head, initial encounter: Secondary | ICD-10-CM | POA: Diagnosis not present

## 2019-09-20 DIAGNOSIS — S0285XA Fracture of orbit, unspecified, initial encounter for closed fracture: Secondary | ICD-10-CM

## 2019-09-20 DIAGNOSIS — Y92512 Supermarket, store or market as the place of occurrence of the external cause: Secondary | ICD-10-CM | POA: Diagnosis not present

## 2019-09-20 DIAGNOSIS — Y999 Unspecified external cause status: Secondary | ICD-10-CM | POA: Diagnosis not present

## 2019-09-20 DIAGNOSIS — Z23 Encounter for immunization: Secondary | ICD-10-CM | POA: Diagnosis not present

## 2019-09-20 DIAGNOSIS — Y939 Activity, unspecified: Secondary | ICD-10-CM | POA: Insufficient documentation

## 2019-09-20 MED ORDER — BACITRACIN ZINC 500 UNIT/GM EX OINT: 1.0000 "application " | TOPICAL_OINTMENT | Freq: Two times a day (BID) | CUTANEOUS | 0 refills | Status: AC

## 2019-09-20 MED ORDER — BACITRACIN ZINC 500 UNIT/GM EX OINT: TOPICAL_OINTMENT | Freq: Once | CUTANEOUS | Status: AC

## 2019-09-20 MED ORDER — LIDOCAINE-EPINEPHRINE (PF) 2 %-1:200000 IJ SOLN
20.0000 mL | Freq: Once | INTRAMUSCULAR | Status: AC
  Administered 2019-09-20: 20 mL
  Filled 2019-09-20: qty 20

## 2019-09-20 MED ORDER — TETANUS-DIPHTH-ACELL PERTUSSIS 5-2.5-18.5 LF-MCG/0.5 IM SUSP
0.5000 mL | Freq: Once | INTRAMUSCULAR | Status: AC
  Administered 2019-09-20: 0.5 mL via INTRAMUSCULAR
  Filled 2019-09-20: qty 0.5

## 2019-09-20 NOTE — Discharge Instructions (Addendum)
Do NOT blow your nose. Keep you head of bed elevated. Follow up with the ENT listed on 5/6 or 5/7. Call your neurologist for your seizure

## 2019-09-20 NOTE — ED Provider Notes (Signed)
Care transferred to me. Star shaped stellate laceration repaired. Discussed orbital fractures with Dr. Annalee Genta, who advises head of bed elevation, no nose blowing, etc. No antibiotics at this time. Will see next week. As for her seizures, CT head negative. History of seizures, now back to baseline. Follow up with neuro.   Marland Kitchen.Laceration Repair  Date/Time: 09/21/2019 1:15 AM Performed by: Pricilla Loveless, MD Authorized by: Pricilla Loveless, MD   Consent:    Consent obtained:  Verbal   Consent given by:  Patient Anesthesia (see MAR for exact dosages):    Anesthesia method:  Local infiltration   Local anesthetic:  Lidocaine 2% WITH epi Laceration details:    Location:  Face   Face location:  Forehead   Length (cm):  4 Repair type:    Repair type:  Intermediate Pre-procedure details:    Preparation:  Imaging obtained to evaluate for foreign bodies and patient was prepped and draped in usual sterile fashion Exploration:    Hemostasis achieved with:  Direct pressure Treatment:    Area cleansed with:  Saline   Amount of cleaning:  Extensive   Irrigation solution:  Sterile saline   Irrigation method:  Syringe Skin repair:    Repair method:  Sutures   Suture size:  5-0   Suture material:  Fast-absorbing gut   Suture technique:  Simple interrupted   Number of sutures:  8 Approximation:    Approximation:  Close Post-procedure details:    Dressing:  Antibiotic ointment   Patient tolerance of procedure:  Tolerated well, no immediate complications      Pricilla Loveless, MD 09/21/19 [phone]

## 2019-09-20 NOTE — ED Triage Notes (Signed)
Pt. Transported via [facility]. Per EMS, pt,. reported walking in [employer] when her head started hurting. Pt. Stated headache occurs before having a seizure. Pt. Started seizing and fell face forward and hit head, seizure lasted 1 min. Pt. Has lac to head. C-collard applied. Pt. Denies neck and back pain.

## 2019-09-20 NOTE — ED Provider Notes (Signed)
[facility] EMERGENCY DEPARTMENT Provider Note   CSN: [id number] Arrival date & time: 09/20/19  2028     History Chief Complaint  Patient presents with  . Seizures  . Headache  . Fall    Diana Henson is a 29 y.o. female.  Patient with hx seizures, indicates just had seizure while at [employer]. States periodically will have a seizure despite meds. States compliant w her meds, and felt well/normal all day. No recent increase in seizure frequency. Had generalized seizure, lasting approximately 1 minute, hit head, laceration to forehead. Dull frontal headache post fall, mild-mod. Hx 'migraines, but denies recent acute or severe headaches. Briefly postictal, but now clear. Denies incontinence, no oral injury.  Denies eye pain or change in vision. No numbness or weakness. No nausea or vomiting. No chest pain or sob. No abd pain. Denies extremity pain or injury. No recent fever, chills or sweats. No uri symptoms. No dysuria or gu c/o. Pt unsure of last tetanus imm.   The history is provided by the patient.  Seizures Headache Associated symptoms: seizures   Associated symptoms: no abdominal pain, no back pain, no cough, no diarrhea, no eye pain, no fever, no numbness, no sore throat, no vomiting and no weakness   Fall Associated symptoms include headaches. Pertinent negatives include no chest pain, no abdominal pain and no shortness of breath.       Past Medical History:  Diagnosis Date  . 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 Seizures ([place])    one seizure due to blocked vp shunt    Patient Active Problem List   Diagnosis Date Noted  . Asymptomatic bacteriuria 11/05/2011    Past Surgical History:  Procedure Laterality Date  . APPENDECTOMY    . VENTRICULOPERITONEAL SHUNT     multiple surgeries since birth     OB History    Gravida  0   Para  0   Term  0   Preterm  0   AB  0   Living  0     SAB  0   TAB  0   Ectopic  0   Multiple  0   Live  Births              Family History  Problem Relation Age of Onset  . Anemia Mother   . Anemia Sister   . Crohn's disease Sister   . Diabetes Maternal Grandfather   . Hypertension Maternal Grandfather   . Kidney disease Maternal Grandfather   . Urticaria Neg Hx   . Immunodeficiency Neg Hx   . Eczema Neg Hx   . Atopy Neg Hx   . Asthma Neg Hx   . Angioedema Neg Hx   . Allergic rhinitis Neg Hx     Social History   Tobacco Use  . Smoking status: Never Smoker  . Smokeless tobacco: Never Used  Substance Use Topics  . Alcohol use: No  . Drug use: No    Home Medications Prior to Admission medications   Medication Sig Start Date End Date Taking? Authorizing Provider  albuterol (VENTOLIN HFA) 108 (90 Base) MCG/ACT inhaler INHALE 2 PUFFS INTO THE LUNGS EVERY 4 (FOUR) HOURS AS NEEDED FOR WHEEZING OR SHORTNESS OF BREATH. 03/03/19   Charlies Silvers, MD  butalbital-acetaminophen-caffeine (FIORICET) 50-325-40 MG tablet Take 1 tablet and can repeat once in 1hr but no more than 2 tablets in 24hrs. No more than 2 days/week. 12/31/18   [provider]  clotrimazole-betamethasone (LOTRISONE) cream  01/02/19   [provider]  EMGALITY 120 MG/ML SOSY ADMINISTER 1 ML UNDER THE SKIN EVERY 30 DAYS 07/16/18   [provider]  fluticasone (FLONASE) 50 MCG/ACT nasal spray Place 2 sprays into both nostrils daily as needed (for stuffy nose). 12/09/18   Fletcher Anon, MD  montelukast (SINGULAIR) 10 MG tablet Take 1 tablet once a day to prevent coughing or wheezing 01/06/19   Fletcher Anon, MD  naproxen (NAPROSYN) 500 MG tablet Take 1 tablet (500 mg total) by mouth 2 (two) times daily. 05/06/19   Arlyn Dunning, PA-C  pantoprazole (PROTONIX) 40 MG tablet Take by mouth. 12/20/18   [provider]  triamcinolone ointment (KENALOG) 0.5 % APPLY TO RASH TWICE DAILY FOR 2 WEEKS. 03/12/19   [provider]  zonisamide (ZONEGRAN) 100 MG capsule Take 200 mg by mouth  daily.    [provider]    Allergies    Patient has no known allergies.  Review of Systems   Review of Systems  Constitutional: Negative for chills and fever.  HENT: Negative for sore throat.   Eyes: Negative for pain, redness and visual disturbance.  Respiratory: Negative for cough and shortness of breath.   Cardiovascular: Negative for chest pain and palpitations.  Gastrointestinal: Negative for abdominal pain, diarrhea and vomiting.  Genitourinary: Negative for dysuria and flank pain.  Musculoskeletal: Negative for back pain.  Skin: Positive for wound.  Neurological: Positive for seizures and headaches. Negative for speech difficulty, weakness and numbness.  Hematological: Does not bruise/bleed easily.  Psychiatric/Behavioral: Negative for confusion.    Physical Exam Updated Vital Signs BP 134/88 (BP Location: Right Arm)   Pulse 84   Temp 98.2 F (36.8 C) (Oral)   Resp 14   Ht 1.651 m (5\' 5" )   Wt 81.6 kg   SpO2 100%   BMI 29.95 kg/m   Physical Exam Vitals and nursing note reviewed.  Constitutional:      Appearance: Normal appearance. She is well-developed.  HENT:     Head:     Comments: 4 cm laceration to forehead, stellate-shaped. No fb seen or felt. Facial bones/orbits grossly intact.     Nose: Nose normal.     Mouth/Throat:     Mouth: Mucous membranes are moist.     Comments: Teeth intact. No malocclusion. No oral injury.  Eyes:     General: No scleral icterus.    Conjunctiva/sclera: Conjunctivae normal.     Pupils: Pupils are equal, round, and reactive to light.  Neck:     Trachea: No tracheal deviation.     Comments: c collar. Mid cervical tenderness. Spine aligned, no step off. Trachea midline. Thyroid not grossly enlarged or tender. No bruits.  Cardiovascular:     Rate and Rhythm: Normal rate and regular rhythm.     Pulses: Normal pulses.     Heart sounds: Normal heart sounds. No murmur. No friction rub. No gallop.   Pulmonary:      Effort: Pulmonary effort is normal. No respiratory distress.     Breath sounds: Normal breath sounds.  Chest:     Chest wall: No tenderness.  Abdominal:     General: Bowel sounds are normal. There is no distension.     Palpations: Abdomen is soft.     Tenderness: There is no abdominal tenderness. There is no guarding.  Genitourinary:    Comments: No cva tenderness.  Musculoskeletal:        General: No  swelling.     Cervical back: No muscular tenderness.     Right lower leg: No edema.     Left lower leg: No edema.     Comments: Mid cervical tenderness, otherwise, CTLS spine, non tender, aligned, no step off. Good rom bil extremities without pain or focal bony tenderness.   Skin:    General: Skin is warm and dry.     Findings: No rash.  Neurological:     Mental Status: She is alert.     Comments: Alert, speech normal. GCS 15. Motor intact bil, stre 5/5. sens grossly intact bil   Psychiatric:        Mood and Affect: Mood normal.     ED Results / Procedures / Treatments   Labs (all labs ordered are listed, but only abnormal results are displayed) Labs Reviewed - No data to display  EKG None  Radiology No results found.  Procedures Procedures (including critical care time)  Medications Ordered in ED Medications  lidocaine-EPINEPHrine (XYLOCAINE W/EPI) 2 %-1:200000 (PF) injection 20 mL (has no administration in time range)  Tdap (BOOSTRIX) injection 0.5 mL (has no administration in time range)    ED Course  I have reviewed the triage vital signs and the nursing notes.  Pertinent labs & imaging results that were available during my care of the patient were reviewed by me and considered in my medical decision making (see chart for details).    MDM Rules/Calculators/A&P                      Imaging studies ordered.   Reviewed nursing notes and prior charts for additional history.  Prior evals for similar symptoms noted.   Tetanus im.   Wound sutured. Bacitracin  applied.   Acetaminophen po. Po fluids.   2120 signed out to Dr Criss Alvine to f/u on CTs when resulted, ask APP to suture forehead lac, and dispo appropriately.      Final Clinical Impression(s) / ED Diagnoses Final diagnoses:  None    Rx / DC Orders ED Discharge Orders    None       Cathren Laine, MD 09/20/19 2123

## 2021-08-01 IMAGING — CR DG CHEST 2V
2 series · 2 of 2 positions shown · non-contrast
Comparison: February 01, 2017

CLINICAL DATA: Chest pain and shortness of breath

EXAM:
CHEST - 2 VIEW

[w chest pa]
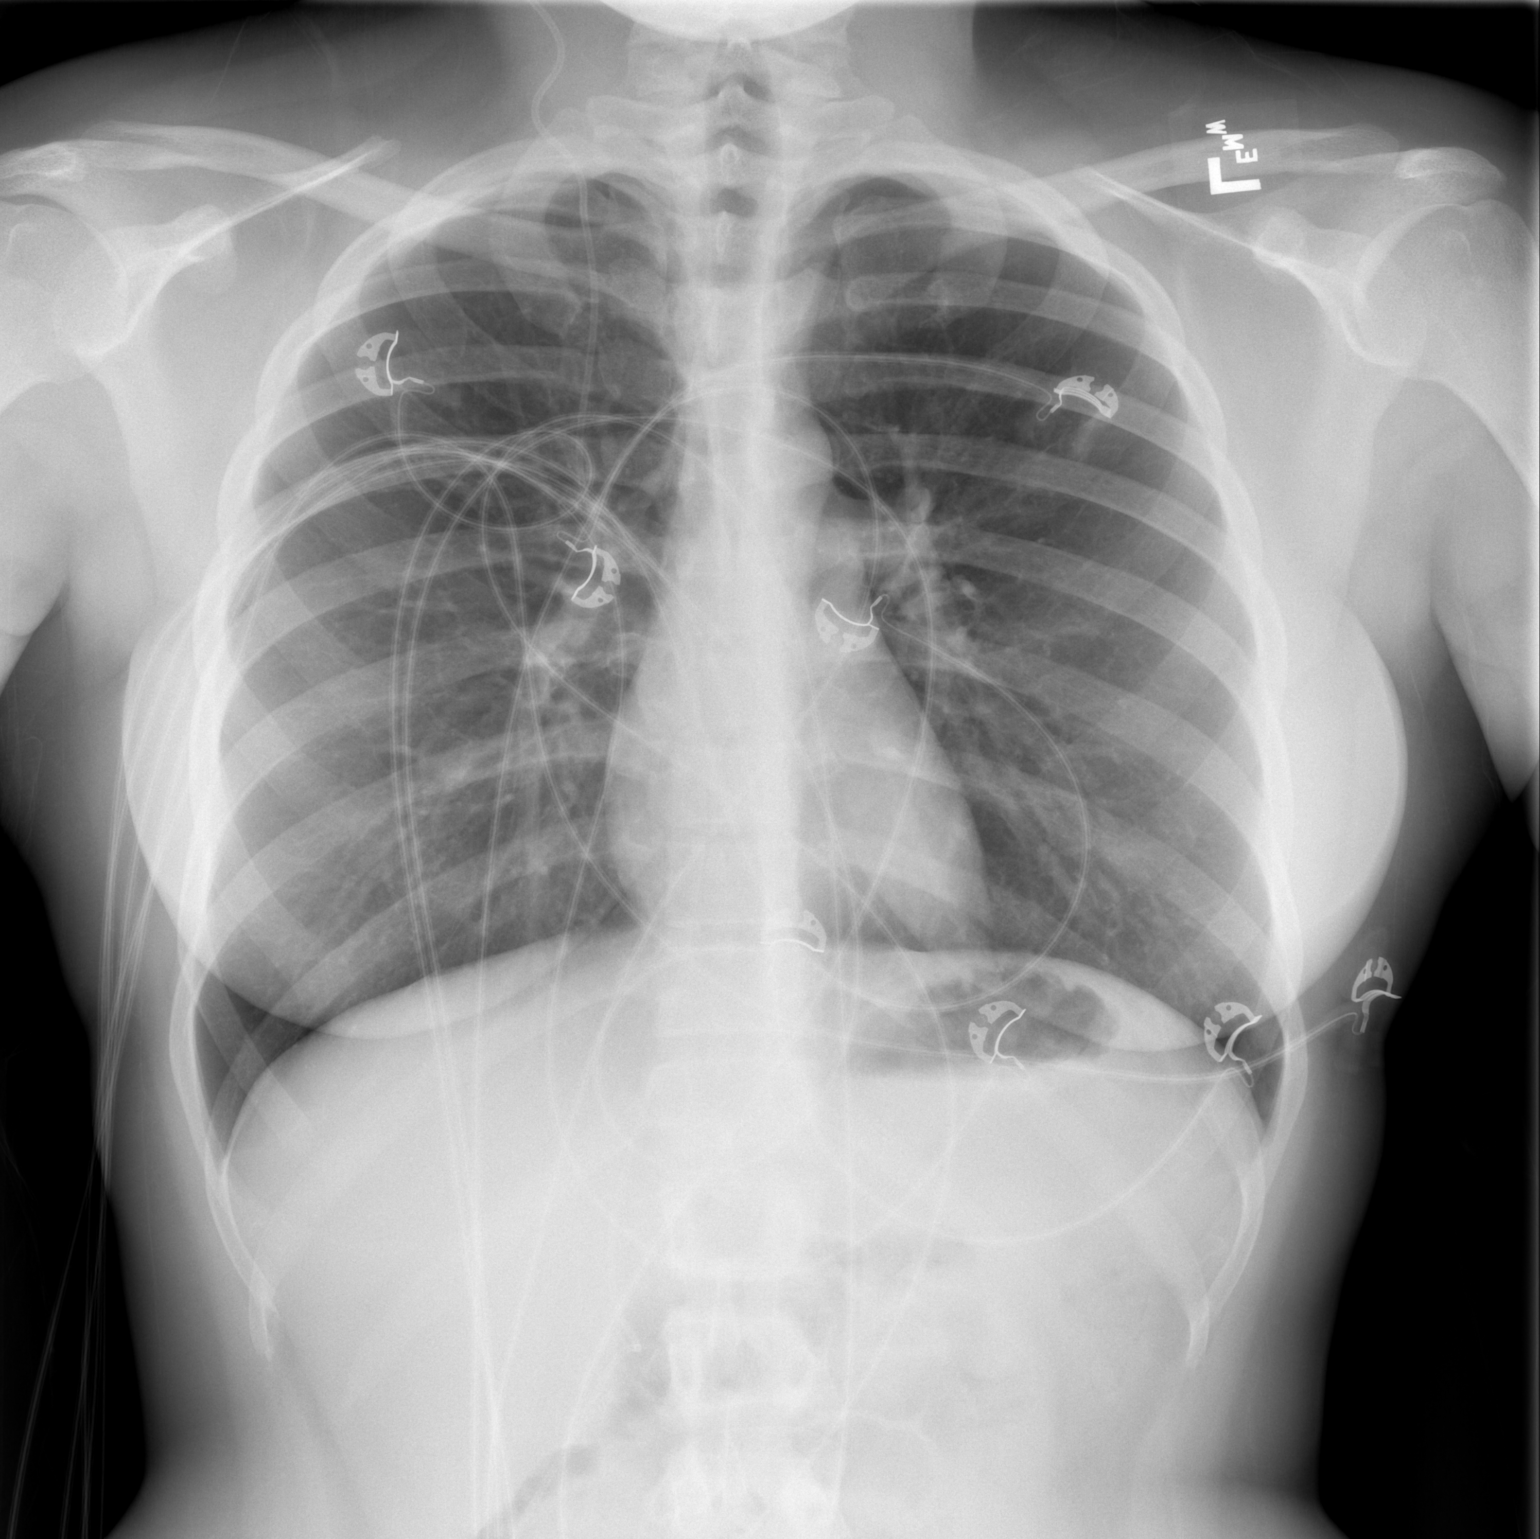

[w chest lat]
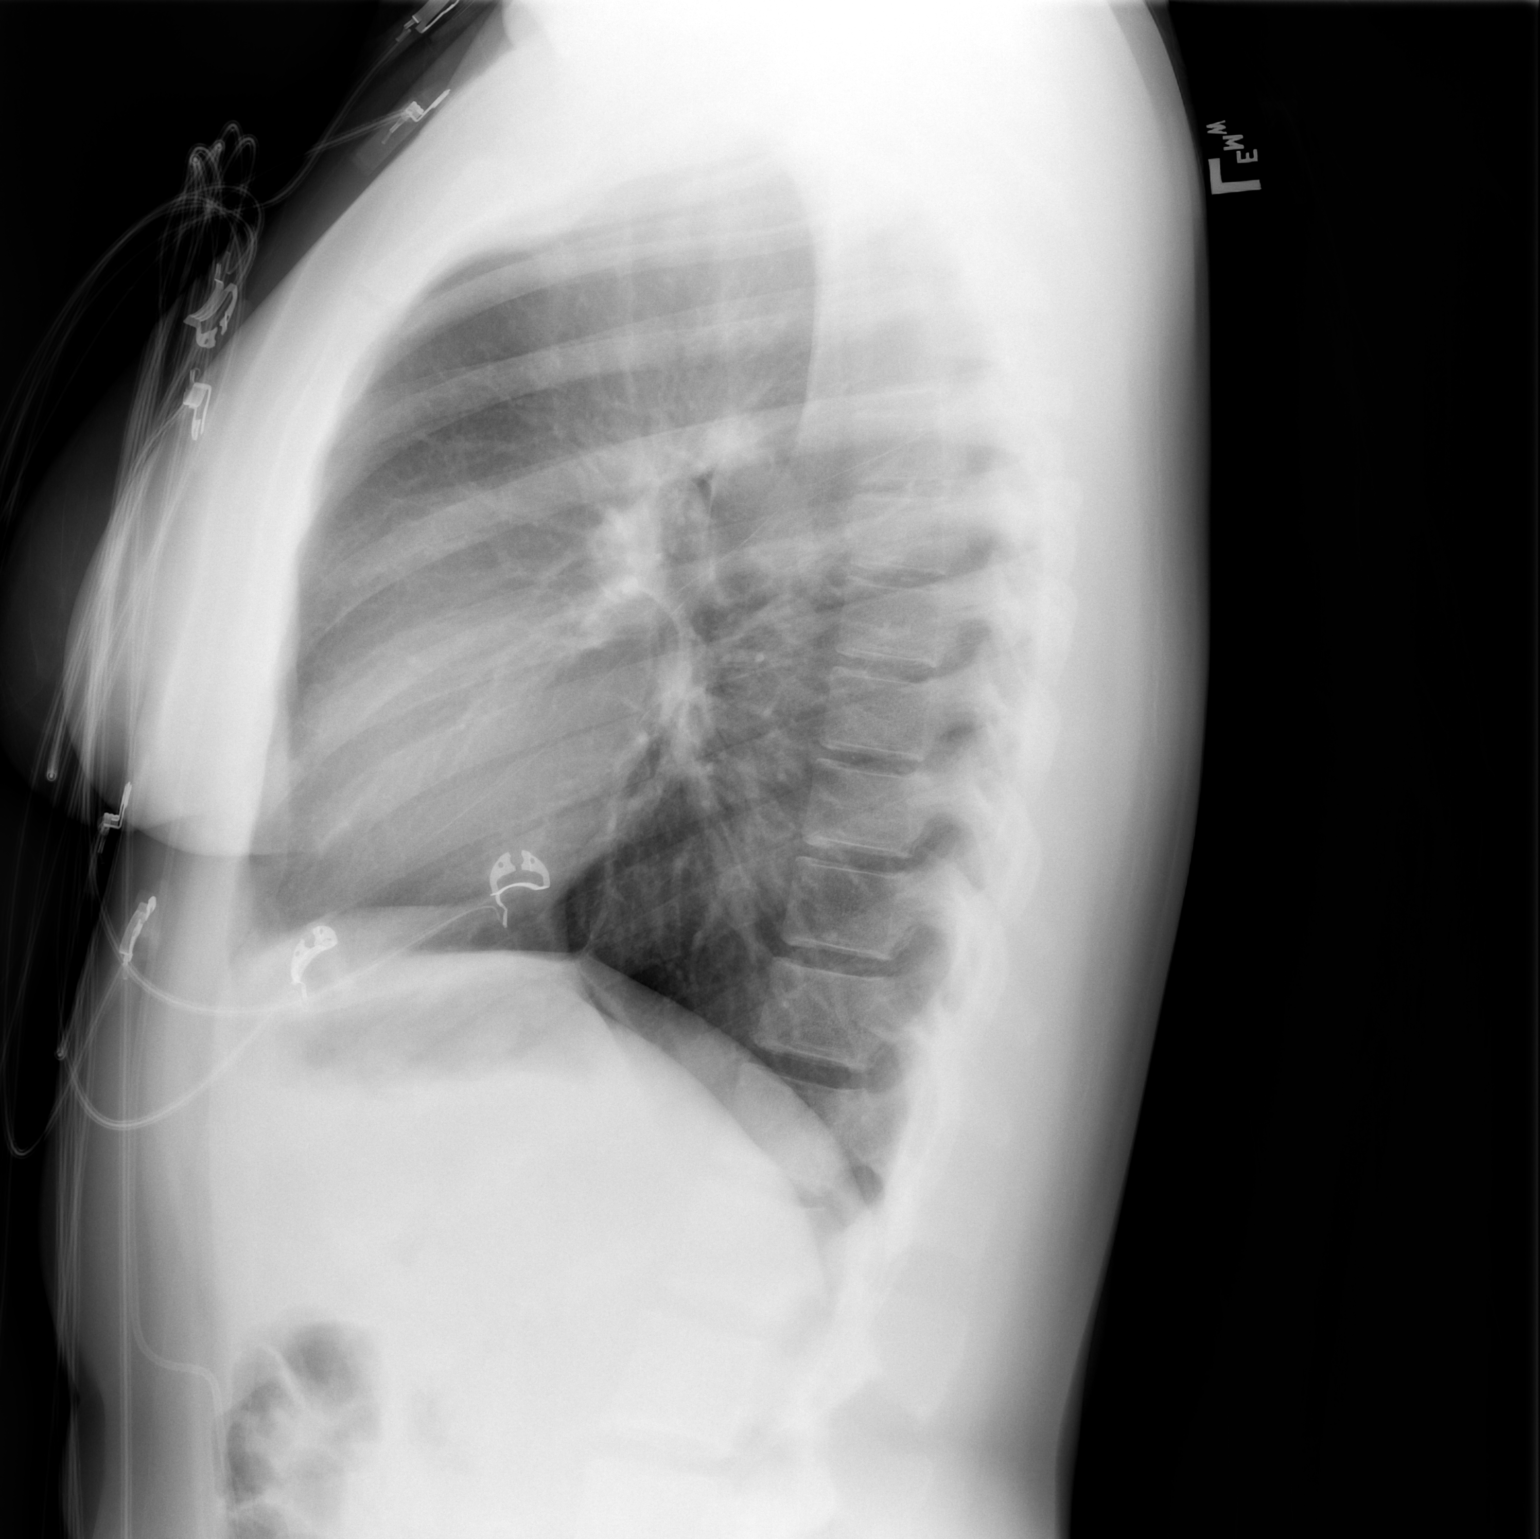

[2 of 2 positions shown; findings below may reference images not displayed]

FINDINGS: The lungs are clear. The heart size and pulmonary vascularity are
normal. No adenopathy. Shunt catheter extends along the medial right
hemithorax. No pneumothorax. No bone lesions.
IMPRESSION: No edema or consolidation.

## 2021-10-25 ENCOUNTER — Other Ambulatory Visit: Payer: Self-pay

## 2021-10-25 ENCOUNTER — Encounter (HOSPITAL_BASED_OUTPATIENT_CLINIC_OR_DEPARTMENT_OTHER): Payer: Self-pay | Admitting: Emergency Medicine

## 2021-10-25 ENCOUNTER — Emergency Department (HOSPITAL_BASED_OUTPATIENT_CLINIC_OR_DEPARTMENT_OTHER)
Admission: EM | Admit: 2021-10-25 | Discharge: 2021-10-26 | Disposition: A | Discharge status: Home / Self Care | Payer: BC Managed Care – PPO | Attending: Emergency Medicine | Admitting: Emergency Medicine

## 2021-10-25 DIAGNOSIS — R220 Localized swelling, mass and lump, head: Secondary | ICD-10-CM | POA: Diagnosis present

## 2021-10-25 DIAGNOSIS — T781XXA Other adverse food reactions, not elsewhere classified, initial encounter: Secondary | ICD-10-CM | POA: Insufficient documentation

## 2021-10-25 NOTE — ED Triage Notes (Signed)
Pt presents with right sided facial swelling and lip swelling since ~930 pm tonight. States swelling started after eating dinner. Possible allergic reaction. Known allergy to tree nuts. Pt took benadryl around 1030 pm. States she feels like swelling has worsened since benadryl. Denies shob, difficulty breathing, throat swelling. NAD.

## 2021-10-26 DIAGNOSIS — R220 Localized swelling, mass and lump, head: Secondary | ICD-10-CM | POA: Diagnosis not present

## 2021-10-26 MED ORDER — DIPHENHYDRAMINE HCL 25 MG PO CAPS
25.0000 mg | ORAL_CAPSULE | Freq: Once | ORAL | Status: AC
  Administered 2021-10-26: 25 mg via ORAL
  Filled 2021-10-26: qty 1

## 2021-10-26 MED ORDER — FAMOTIDINE 20 MG PO TABS
20.0000 mg | ORAL_TABLET | Freq: Once | ORAL | Status: AC
  Administered 2021-10-26: 20 mg via ORAL
  Filled 2021-10-26: qty 1

## 2021-10-26 NOTE — ED Notes (Signed)
Patient denies shob and throat swelling.

## 2021-10-26 NOTE — ED Provider Notes (Signed)
[facility] Provider Note: Lowella Dell, MD, FACEP  CSN: [id number] MRN: [id number] ARRIVAL: 10/25/21 at 2257 ROOM: [facility]01/[facility]01   CHIEF COMPLAINT  Allergic Reaction   HISTORY OF PRESENT ILLNESS  10/26/21 12:29 AM Diana Henson is a 31 y.o. female who developed right side facial swelling about 9:30 PM.  This began after eating dinner and was possibly due to unknowingly ingesting tree nuts as she does have a known allergy to tree nuts.  She took 25 mg of Benadryl about 10:30 PM but did not feel like it was getting any better.  Symptoms are mild to moderate.  She is not having any throat swelling, shortness of breath, wheezing, nausea, vomiting or diarrhea.   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   Seizures ([facility])    one seizure due to blocked vp shunt    Past Surgical History:  Procedure Laterality Date   APPENDECTOMY     VENTRICULOPERITONEAL SHUNT     multiple surgeries since birth    Family History  Problem Relation Age of Onset   Anemia Mother    Anemia Sister    Crohn's disease Sister    Diabetes Maternal Grandfather    Hypertension Maternal Grandfather    Kidney disease Maternal Grandfather    Urticaria Neg Hx    Immunodeficiency Neg Hx    Eczema Neg Hx    Atopy Neg Hx    Asthma Neg Hx    Angioedema Neg Hx    Allergic rhinitis Neg Hx     Social History   Tobacco Use   Smoking status: Never   Smokeless tobacco: Never  Vaping Use   Vaping Use: Never used  Substance Use Topics   Alcohol use: No   Drug use: No    Prior to Admission medications   Medication Sig Start Date End Date Taking? Authorizing Provider  albuterol (VENTOLIN HFA) 108 (90 Base) MCG/ACT inhaler INHALE 2 PUFFS INTO THE LUNGS EVERY 4 (FOUR) HOURS AS NEEDED FOR WHEEZING OR SHORTNESS OF BREATH. 03/03/19   Fletcher Anon, MD  fluticasone (FLONASE) 50 MCG/ACT nasal spray Place 2 sprays into both nostrils daily as needed (for stuffy nose). 12/09/18    Fletcher Anon, MD  zonisamide (ZONEGRAN) 100 MG capsule Take 400 mg by mouth at bedtime.     [provider]    Allergies Patient has no known allergies.   REVIEW OF SYSTEMS  Negative except as noted here or in the History of Present Illness.   PHYSICAL EXAMINATION  Initial Vital Signs Blood pressure 130/77, pulse 81, temperature 98.2 F (36.8 C), temperature source Oral, resp. rate 16, weight 87.1 kg, last menstrual period 10/10/2021, SpO2 100 %.  Examination General: Well-developed, well-nourished female in no acute distress; appearance consistent with age of record HENT: normocephalic; atraumatic; mild edema of right side of face and lips; no pharyngeal edema; no tongue edema; no stridor; no dysphonia Eyes: pupils equal, round and reactive to light; extraocular muscles intact Neck: supple Heart: regular rate and rhythm Lungs: clear to auscultation bilaterally Abdomen: soft; nondistended; nontender; bowel sounds present Extremities: No deformity; full range of motion; pulses normal Neurologic: Awake, alert and oriented; motor function intact in all extremities and symmetric; no facial droop Skin: Warm and dry Psychiatric: Normal mood and affect   RESULTS  Summary of this visit's results, reviewed and interpreted by myself:   EKG Interpretation  Date/Time:    Ventricular Rate:    PR  Interval:    QRS Duration:   QT Interval:    QTC Calculation:   R Axis:     Text Interpretation:         Laboratory Studies: No results found for this or any previous visit (from the past 24 hour(s)). Imaging Studies: No results found.  ED COURSE and MDM  Nursing notes, initial and subsequent vitals signs, including pulse oximetry, reviewed and interpreted by myself.  Vitals:   10/25/21 2331 10/25/21 2336  BP:  130/77  Pulse:  81  Resp:  16  Temp:  98.2 F (36.8 C)  TempSrc:  Oral  SpO2:  100%  Weight: 87.1 kg    Medications  diphenhydrAMINE (BENADRYL)  capsule 25 mg (has no administration in time range)  famotidine (PEPCID) tablet 20 mg (has no administration in time range)   The patient's symptoms appear mild and are limited to her lips.  I do not believe she is in danger of airway compromise at this time.  We will give an additional dose of Benadryl and famotidine prior to discharge.   PROCEDURES  Procedures   ED DIAGNOSES     ICD-10-CM   1. Allergic reaction to food, initial encounter  T78.1XXA          Sye Schroepfer, Jonny Ruiz, MD 10/26/21 [phone]

## 2021-11-01 ENCOUNTER — Ambulatory Visit: Payer: BC Managed Care – PPO | Admitting: Internal Medicine

## 2021-11-01 ENCOUNTER — Encounter: Payer: Self-pay | Admitting: Internal Medicine

## 2021-11-01 VITALS — BP 126/56 | HR 78 | Temp 98.0°F | Resp 18 | Ht 65.0 in | Wt 205.5 lb

## 2021-11-01 DIAGNOSIS — Z8669 Personal history of other diseases of the nervous system and sense organs: Secondary | ICD-10-CM

## 2021-11-01 DIAGNOSIS — T781XXA Other adverse food reactions, not elsewhere classified, initial encounter: Secondary | ICD-10-CM

## 2021-11-01 DIAGNOSIS — L500 Allergic urticaria: Secondary | ICD-10-CM

## 2021-11-01 DIAGNOSIS — Z982 Presence of cerebrospinal fluid drainage device: Secondary | ICD-10-CM

## 2021-11-01 DIAGNOSIS — T782XXA Anaphylactic shock, unspecified, initial encounter: Secondary | ICD-10-CM

## 2021-11-01 DIAGNOSIS — J4530 Mild persistent asthma, uncomplicated: Secondary | ICD-10-CM | POA: Diagnosis not present

## 2021-11-01 DIAGNOSIS — T781XXD Other adverse food reactions, not elsewhere classified, subsequent encounter: Secondary | ICD-10-CM

## 2021-11-01 DIAGNOSIS — J3089 Other allergic rhinitis: Secondary | ICD-10-CM

## 2021-11-01 MED ORDER — MONTELUKAST SODIUM 10 MG PO TABS: 10.0000 mg | ORAL_TABLET | Freq: Every day | ORAL | 5 refills | Status: AC

## 2021-11-01 MED ORDER — CETIRIZINE HCL 10 MG PO TABS: 10.0000 mg | ORAL_TABLET | Freq: Every day | ORAL | 5 refills | Status: AC

## 2021-11-01 MED ORDER — FLUTICASONE PROPIONATE 50 MCG/ACT NA SUSP: 1.0000 | Freq: Every day | NASAL | 2 refills | Status: DC

## 2021-11-01 MED ORDER — EPINEPHRINE 0.3 MG/0.3ML IJ SOAJ: 0.3000 mg | INTRAMUSCULAR | 1 refills | Status: DC | PRN

## 2021-11-01 MED ORDER — FLUTICASONE PROPIONATE 50 MCG/ACT NA SUSP: 1.0000 | Freq: Every day | NASAL | 2 refills | Status: AC

## 2021-11-01 MED ORDER — ALBUTEROL SULFATE HFA 108 (90 BASE) MCG/ACT IN AERS: 2.0000 | INHALATION_SPRAY | Freq: Four times a day (QID) | RESPIRATORY_TRACT | 2 refills | Status: AC | PRN

## 2021-11-01 MED ORDER — EPINEPHRINE 0.3 MG/0.3ML IJ SOAJ: 0.3000 mg | INTRAMUSCULAR | 1 refills | Status: AC | PRN

## 2021-11-01 MED ORDER — ALBUTEROL SULFATE HFA 108 (90 BASE) MCG/ACT IN AERS: 2.0000 | INHALATION_SPRAY | Freq: Four times a day (QID) | RESPIRATORY_TRACT | 2 refills | Status: DC | PRN

## 2021-11-01 NOTE — Patient Instructions (Addendum)
Food allergy:  - today's skin testing was positive to soy negative to wheat, sesame, rice, chicken, apple, strawberry, ginger, garlic, pepper - We will get blood work for additional spices, please avoid Chilli Pepper and Paprika until these result  - please strictly avoid soy, chilli and paprika  - okay to resume eating wheat, sesame, rice, chicken, apple, strawberry, ginger, garlic,  - for SKIN only reaction, okay to take Benadryl 1 capsules every 6 hours - for SKIN + ANY additional symptoms, OR IF concern for LIFE THREATENING reaction = Epipen Autoinjector EpiPen 0.3 mg. - If using Epinephrine autoinjector, call 911 - A food allergy action plan has been provided and discussed. - Medic Alert identification is recommended.    Mild Intermittent  Asthma: well controlled  - Breathing test today showed: looked great!  - Based on symptoms and breathing tests your asthma is well controlled  PLAN: . - Daily controller medication(s): None indicated  - Prior to physical activity: albuterol 2 puffs 10-15 minutes before physical activity. - Rescue medications: albuterol 4 puffs every 4-6 hours as needed - Get Influenza Vaccine and appropriate Pneumonia and COVID 19 boosters  - Asthma control goals:  * Full participation in all desired activities (may need albuterol before activity) * Albuterol use two time or less a week on average (not counting use with activity) * Cough interfering with sleep two time or less a month * Oral steroids no more than once a year * No hospitalizations  Allergic  Rhinitis: moderately well  controlled  - Testing today showed positive grass, weeds, trees, molds, dust mite, cat - Copy of test results provided.  - Avoidance measures provided. - Continue with: Zyrtec (cetirizine) 10mg  tablet once daily - Start taking: Flonase (fluticasone) one spray per nostril daily as needed for stuffy nose  - You can use an extra dose of the antihistamine, if needed, for  breakthrough symptoms.  - Consider nasal saline rinses 1-2 times daily to remove allergens from the nasal cavities as well as help with mucous clearance (this is especially helpful to do before the nasal sprays are given) - Consider allergy shots as a means of long-term control and can reduce lifetime use of medications  - Allergy shots "re-train" and "reset" the immune system to ignore environmental allergens and decrease the resulting immune response to those allergens (sneezing, itchy watery eyes, runny nose, nasal congestion, etc).    - Allergy shots improve symptoms in 75-85%  - Allergy shots are the only potential permanent and disease modifying option  - We can discuss more at the next appointment if the medications are not working for you.   Oral Allergy Syndrome: Apples and strawberries  - Your symptoms are not consistent with true food allergies, and are more likely to be due to oral allergy syndrome. - These symptoms are not life-threatening and are because of a cross reaction between a pollen you are allergic to, and to a protein in specific foods (such as fresh fruits, vegetables, and nuts). - If you can eat these things it is fine to continue to do so.  If not, you may avoid these fresh fruits.  Heating these foods should allow them to be consumed without symptoms. - You may notice increase in symptoms during allergy season, this is to be expected. - Allergy  Immunotherapy can help lessen and some cases cure these symptoms and should be considered if they worsen.    Follow up: we will contact you with blood work results  and go from there.  If negative I may recommend an oral food challenge to soy to better define allergies.   Follow up in clinic in 6 months regardless   Thank you so much for letting me partake in your care today.  Don't hesitate to reach out if you have any additional concerns!  Diana Luz, MD  [facility] [place], [place]

## 2021-11-01 NOTE — Progress Notes (Signed)
Follow Up Note  RE: Diana Henson MRN: [id number] DOB: June 08, 1990 Date of Office Visit: 11/01/2021  Referring provider: Inez CatalinaWeiss, Samuel M, PA-C Primary care provider: Inez CatalinaWeiss, Samuel M, PA-C  Chief Complaint: Allergy Testing and Allergic Reaction (Pt states she ate some chicken with thai inspire sweet chill sauce and her face swelled up. She took benadryl and went to ER and she was given more benadryl and prednisone.)  History of Present Illness: I had the pleasure of seeing Diana Henson for a follow up visit at the [facility] on 11/01/2021. She is a 31 y.o. female with a past medical history of migraine headaches and is status post VP shunt for history of hydrocephalus, who is being followed for persistent asthma, urticaria, chronic rhinitis, oral allergy syndrome. Her previous allergy office visit was on 01/06/2019 with Dr. Beaulah DinningBardelas. Today is a new complaint visit of allergic reaction  .  History obtained from patient.  Concern for Food Allergy:  Food of concern: Chicken with [place] sweet chili sauce History of reaction: She is eating chicken with [place] sweet chili sauce and within 30 minutes developed facial swelling associated with conjunctival erythema, throat clearing cough and itchy throat.  She presented to the ER where she was treated with Benadryl and prednisone. Symptoms resolved with a 6 hours. She has since eating chicken without issue, but not the other ingredients.   Previous allergy testing  negative food testing in 2020:  She is still avoiding tree nuts based on testing, not based on prior reactions  Eats egg, dairy, wheat, soy, fish, shellfish, peanuts,, sesame without reactions Carries an epinephrine autoinjector: no Has food allergy action plan no   ASTHMA - Medical therapy: none  - Rescue inhaler use: none  - Symptoms: rare chest tightness  - Exacerbation history: 0 ABX for respiratory illness since last visit, 0 OCS, 0ED, 0 UC visits in the past  year  - ACT: 21 /25 - Adverse effects of medication: denies  - Previous FEV1: 3.18 L  Allergic rhinitis: current therapy: Zyrtec 10 mg as needed ,  symptoms improved symptoms include:  itchy throat, rhinorrhea, post nasal drainage, and itchy eyes Previous allergy testing:  7/20: Positive to grass, weed, tree, mold, cat negative testing to foods History of reflux/heartburn: no Interested in Allergy Immunotherapy: no   Oral allergy syndrome: -She continues to avoid fresh apples and strawberries, denies any additional trigger foods.    Assessment and Plan: Diana Henson is a 31 y.o. female with: Anaphylaxis, initial encounter - Plan: Allergen,Grn Pepper,Paprika,[id number], IgE Nut Prof. w/Component Rflx, Allergy Test, Soybean IgE, Allergen,Chili Pepper,[id number], CANCELED: Allergen,Chili Pepper,[id number]  Mild persistent asthma without complication - Plan: Spirometry with Graph, Allergy Test  Seasonal and perennial allergic rhinitis - Plan: Allergy Test  Allergic urticaria  S/P VP shunt  History of hydrocephalus  History of migraine headaches  Pollen-food allergy, subsequent encounter - Plan: Allergy Test Plan: Patient Instructions  Food allergy:  - today's skin testing was positive to soy negative to wheat, sesame, rice, chicken, apple, strawberry, ginger, garlic, pepper - We will get blood work for additional spices, please avoid Chilli Pepper and Paprika until these result  - please strictly avoid soy, chilli and paprika  - okay to resume eating wheat, sesame, rice, chicken, apple, strawberry, ginger, garlic,  - for SKIN only reaction, okay to take Benadryl 1 capsules every 6 hours - for SKIN + ANY additional symptoms, OR IF concern for LIFE THREATENING reaction = Epipen Autoinjector EpiPen 0.3  mg. - If using Epinephrine autoinjector, call 911 - A food allergy action plan has been provided and discussed. - Medic Alert identification is recommended.    Mild Intermittent  Asthma: well  controlled  - Breathing test today showed: looked great!  - Based on symptoms and breathing tests your asthma is well controlled  PLAN: . - Daily controller medication(s): None indicated  - Prior to physical activity: albuterol 2 puffs 10-15 minutes before physical activity. - Rescue medications: albuterol 4 puffs every 4-6 hours as needed - Get Influenza Vaccine and appropriate Pneumonia and COVID 19 boosters  - Asthma control goals:  * Full participation in all desired activities (may need albuterol before activity) * Albuterol use two time or less a week on average (not counting use with activity) * Cough interfering with sleep two time or less a month * Oral steroids no more than once a year * No hospitalizations  Allergic  Rhinitis: moderately well  controlled  - Testing today showed positive grass, weeds, trees, molds, dust mite, cat - Copy of test results provided.  - Avoidance measures provided. - Continue with: Zyrtec (cetirizine) 10mg  tablet once daily - Start taking: Flonase (fluticasone) one spray per nostril daily as needed for stuffy nose  - You can use an extra dose of the antihistamine, if needed, for breakthrough symptoms.  - Consider nasal saline rinses 1-2 times daily to remove allergens from the nasal cavities as well as help with mucous clearance (this is especially helpful to do before the nasal sprays are given) - Consider allergy shots as a means of long-term control and can reduce lifetime use of medications  - Allergy shots "re-train" and "reset" the immune system to ignore environmental allergens and decrease the resulting immune response to those allergens (sneezing, itchy watery eyes, runny nose, nasal congestion, etc).    - Allergy shots improve symptoms in 75-85%  - Allergy shots are the only potential permanent and disease modifying option  - We can discuss more at the next appointment if the medications are not working for you.   Oral Allergy Syndrome:  Apples and strawberries  - Your symptoms are not consistent with true food allergies, and are more likely to be due to oral allergy syndrome. - These symptoms are not life-threatening and are because of a cross reaction between a pollen you are allergic to, and to a protein in specific foods (such as fresh fruits, vegetables, and nuts). - If you can eat these things it is fine to continue to do so.  If not, you may avoid these fresh fruits.  Heating these foods should allow them to be consumed without symptoms. - You may notice increase in symptoms during allergy season, this is to be expected. - Allergy  Immunotherapy can help lessen and some cases cure these symptoms and should be considered if they worsen.    Follow up: we will contact you with blood work results and go from there.  If negative I may recommend an oral food challenge to soy to better define allergies.   Follow up in clinic in 6 months regardless   Thank you so much for letting me partake in your care today.  Don't hesitate to reach out if you have any additional concerns!  08-09-1995, MD  [facility] [place] [place]   No follow-ups on file.  Meds ordered this encounter  Medications   DISCONTD: albuterol (VENTOLIN HFA) 108 (90 Base) MCG/ACT inhaler    Sig:  Inhale 2 puffs into the lungs every 6 (six) hours as needed for wheezing or shortness of breath.    Dispense:  8 g    Refill:  2   DISCONTD: EPINEPHrine 0.3 mg/0.3 mL IJ SOAJ injection    Sig: Inject 0.3 mg into the muscle as needed for anaphylaxis.    Dispense:  1 each    Refill:  1   DISCONTD: fluticasone (FLONASE) 50 MCG/ACT nasal spray    Sig: Place 1 spray into both nostrils daily.    Dispense:  16 g    Refill:  2   albuterol (VENTOLIN HFA) 108 (90 Base) MCG/ACT inhaler    Sig: Inhale 2 puffs into the lungs every 6 (six) hours as needed for wheezing or shortness of breath.    Dispense:  8 g    Refill:  2   cetirizine (ZYRTEC) 10 MG  tablet    Sig: Take 1 tablet (10 mg total) by mouth daily.    Dispense:  30 tablet    Refill:  5   EPINEPHrine 0.3 mg/0.3 mL IJ SOAJ injection    Sig: Inject 0.3 mg into the muscle as needed for anaphylaxis.    Dispense:  1 each    Refill:  1   fluticasone (FLONASE) 50 MCG/ACT nasal spray    Sig: Place 1 spray into both nostrils daily.    Dispense:  16 g    Refill:  2   montelukast (SINGULAIR) 10 MG tablet    Sig: Take 1 tablet (10 mg total) by mouth at bedtime.    Dispense:  30 tablet    Refill:  5    Lab Orders         Allergen,Grn Pepper,Paprika,[id number]         IgE Nut Prof. w/Component Rflx         Soybean IgE         Allergen,Chili Pepper,[id number]     Diagnostics: Spirometry:  Tracings reviewed. Her effort: Good reproducible efforts. FVC: 3.78 L FEV1: 2.74 L, 95% predicted FEV1/FVC ratio: 72% Interpretation: Spirometry consistent with normal pattern.  Please see scanned spirometry results for details.  Skin Testing: Environmental allergy panel and select foods. FOOD testing was positive to soy negative to wheat, sesame, rice, chicken, apple, strawberry, ginger, garlic,  ENVIRONMENTAL: positive grass, weeds, trees, molds, dust mite, cat Results interpreted by myself during this encounter and discussed with patient/family.  Airborne Adult Perc - 11/01/21 1221     Time Antigen Placed 1222    Allergen Manufacturer Waynette Buttery    Location Back    Number of Test 59    Panel 1 Select    1. Control-Buffer 50% Glycerol Negative    2. Control-Histamine 1 mg/ml 4+    3. Albumin saline Negative    4. Bahia 4+    5. [place] 4+    6. Johnson 2+    7. Kentucky Blue 4+    8. Meadow Fescue 4+    9. Perennial Rye 4+    10. Sweet Vernal 2+    11. Timothy 4+    12. Cocklebur 3+    13. Burweed Marshelder 3+    14. Ragweed, short 3+    15. Ragweed, Giant 3+    16. Plantain,  English 3+    17. Lamb's Quarters 3+    18. Sheep Sorrell 3+    19. Rough Pigweed 3+    20. Marsh Elder,  Rough 3+    21. Mugwort, Common  3+    22. Ash mix 3+    23. Birch mix 3+    24. Beech American 3+    25. Box, Elder 3+    26. Cedar, red 4+    27. Cottonwood, Eastern 4+    28. Elm mix 3+    29. Hickory 4+    30. Maple mix 3+    31. Oak, Guinea[place]issau mix 3+    32. Pecan Pollen 4+    33. Pine mix Negative    34. Sycamore Eastern 3+    35. Walnut, Black Pollen 4+    36. Alternaria alternata Negative    37. Cladosporium Herbarum Negative    38. Aspergillus mix Negative    39. Penicillium mix Negative    40. Bipolaris sorokiniana (Helminthosporium) Negative    41. Drechslera spicifera (Curvularia) Negative    42. Mucor plumbeus 3+    43. Fusarium moniliforme 3+    44. Aureobasidium pullulans (pullulara) 3+    45. Rhizopus oryzae Negative    46. Botrytis cinera Negative    47. Epicoccum nigrum Negative    48. Phoma betae 3+    49. Candida Albicans Negative    50. Trichophyton mentagrophytes Negative    51. Mite, D Farinae  5,000 AU/ml Negative    52. Mite, D Pteronyssinus  5,000 AU/ml 3+    53. Cat Hair 10,000 BAU/ml 4+    54.  Dog Epithelia Negative    55. Mixed Feathers Negative    56. Horse Epithelia Negative    57. Cockroach, German Negative    58. Mouse Negative    59. Tobacco Leaf Negative             Food Adult Perc - 11/01/21 1200     Time Antigen Placed 1222    Allergen Manufacturer Waynette Buttery    Location Back    Number of allergen test 10    2. Soybean --   4*5   3. Wheat Negative    4. Sesame Negative    34. Rice Negative    39. Chicken Meat Negative    58. Apple Negative    60. Strawberry Negative    69. Ginger Negative    70. Garlic Negative    71. Pepper, black Negative             Medication List:  Current Outpatient Medications  Medication Sig Dispense Refill   folic acid (FOLVITE) 1 MG tablet Take by mouth.     levETIRAcetam (KEPPRA) 500 MG tablet Take by mouth.     zonisamide (ZONEGRAN) 100 MG capsule Take 400 mg by mouth at bedtime.       albuterol (VENTOLIN HFA) 108 (90 Base) MCG/ACT inhaler Inhale 2 puffs into the lungs every 6 (six) hours as needed for wheezing or shortness of breath. 8 g 2   cetirizine (ZYRTEC) 10 MG tablet Take 1 tablet (10 mg total) by mouth daily. 30 tablet 5   EPINEPHrine 0.3 mg/0.3 mL IJ SOAJ injection Inject 0.3 mg into the muscle as needed for anaphylaxis. 1 each 1   fluticasone (FLONASE) 50 MCG/ACT nasal spray Place 1 spray into both nostrils daily. 16 g 2   montelukast (SINGULAIR) 10 MG tablet Take 1 tablet (10 mg total) by mouth at bedtime. 30 tablet 5   No current facility-administered medications for this visit.   Allergies: No Known Allergies I reviewed her past medical history, social history, family history, and environmental history and no significant changes have been  reported from her previous visit.  ROS: All others negative except as noted per HPI.   Objective: BP (!) 126/56   Pulse 78   Temp 98 F (36.7 C) (Temporal)   Resp 18   Ht  (1.651 m)   Wt 205 lb 8 oz (93.2 kg)   LMP 10/10/2021 (Approximate)   SpO2 100%   BMI 34.20 kg/m  Body mass index is 34.2 kg/m. General Appearance:  Alert, cooperative, no distress, appears stated age  Head:  Normocephalic, without obvious abnormality, atraumatic  Eyes:  Conjunctiva clear, EOM's intact  Nose: Nares normal, hypertrophic turbinates, no visible anterior polyps, and septum midline  Throat: Lips, tongue normal; teeth and gums normal, normal posterior oropharynx and no tonsillar exudate  Neck: Supple, symmetrical  Lungs:   clear to auscultation bilaterally, Respirations unlabored, no coughing  Heart:  regular rate and rhythm and no murmur, Appears well perfused  Extremities: No edema  Skin: Skin color, texture, turgor normal, no rashes or lesions on visualized portions of skin  Neurologic: No gross deficits   Previous notes and tests were reviewed. The plan was reviewed with the patient/family, and all questions/concerned  were addressed.  It was my pleasure to see Diana Henson today and participate in her care. Please feel free to contact me with any questions or concerns.  Sincerely,  Ferol Luz, MD  Allergy & Immunology  [facility] Office: [phone]

## 2021-11-04 LAB — ALLERGEN SOYBEAN: Soybean IgE: 0.48 kU/L — AB

## 2021-11-06 LAB — IGE NUT PROF. W/COMPONENT RFLX
F017-IgE Hazelnut (Filbert): 0.45 kU/L — AB
F018-IgE Brazil Nut: <0.1 kU/L
F020-IgE Almond: 0.1 kU/L — AB
F202-IgE Cashew Nut: <0.1 kU/L
F203-IgE Pistachio Nut: 1.02 kU/L — AB
F256-IgE Walnut: 0.38 kU/L — AB
Macadamia Nut, IgE: <0.1 kU/L
Peanut, IgE: 0.51 kU/L — AB
Pecan Nut IgE: <0.1 kU/L

## 2021-11-06 LAB — PEANUT COMPONENTS
F352-IgE Ara h 8: <0.1 kU/L
F422-IgE Ara h 1: <0.1 kU/L
F423-IgE Ara h 2: <0.1 kU/L
F424-IgE Ara h 3: <0.1 kU/L
F427-IgE Ara h 9: <0.1 kU/L
F447-IgE Ara h 6: <0.1 kU/L

## 2021-11-06 LAB — PANEL 604726
Cor A 1 IgE: <0.1 kU/L
Cor A 14 IgE: <0.1 kU/L
Cor A 8 IgE: <0.1 kU/L
Cor A 9 IgE: <0.1 kU/L

## 2021-11-06 LAB — ALLERGEN,CHILI PEPPER,RF279: F279-IgE Chili Pepper: <0.1 kU/L

## 2021-11-06 LAB — ALLERGEN COMPONENT COMMENTS

## 2021-11-06 LAB — ALLERGEN,GRN PEPPER,PAPRIKA,F218: Paprika IgE: 0.1 kU/L — AB

## 2021-11-06 LAB — PANEL 604721
Jug R 1 IgE: <0.1 kU/L
Jug R 3 IgE: 0.6 kU/L — AB

## 2021-11-07 NOTE — Progress Notes (Signed)
Blood work returned mildly positive to soy and paprika.  Based on reaction I would recommend avoidance of paprika but we consider oral food challenge in the clinic to soy.  Tree nuts returned mildly positive.  Given that Diana Henson has not had a reaction to tree nuts previously is just avoiding based on testing, we could also consider oral food challenge to tree nuts as well if she is interested in reintroducing these foods.  Chili pepper returned negative and she could consider reintroduction of this food at home.  Please let patient know the results and have her for scheduled soy challenge.  If she does well with this we will move onto tree nuts.  Thank you! EML

## 2021-11-29 ENCOUNTER — Encounter: Payer: BC Managed Care – PPO | Admitting: Internal Medicine

## 2021-12-13 ENCOUNTER — Ambulatory Visit (INDEPENDENT_AMBULATORY_CARE_PROVIDER_SITE_OTHER): Payer: BC Managed Care – PPO | Admitting: Internal Medicine

## 2021-12-13 ENCOUNTER — Encounter: Payer: Self-pay | Admitting: Internal Medicine

## 2021-12-13 VITALS — BP 124/60 | HR 90 | Temp 98.3°F | Resp 17 | Ht 66.0 in | Wt 207.8 lb

## 2021-12-13 DIAGNOSIS — T7800XA Anaphylactic reaction due to unspecified food, initial encounter: Secondary | ICD-10-CM | POA: Diagnosis not present

## 2021-12-13 NOTE — Progress Notes (Signed)
Follow Up Note  RE: Diana Henson MRN: [id number] DOB: Oct 25, 1990 Date of Office Visit: 12/13/2021  Referring provider: Inez Catalina, PA-C Primary care provider: Inez Catalina, PA-C  Chief Complaint:Food/Drug Challenge (Soy milk)  History of Present Illness: I had the pleasure of seeing Diana Henson for a follow up visit at the [facility] on 12/13/2021. She is a 31 y.o. female, who is being followed for anaphylaxis . Her previous allergy office visit was on 11/01/21 with Dr. Marlynn Perking. Today she is here for soy food challenge.   History of Reaction: She is eating chicken with [place] sweet chili sauce and within 30 minutes developed facial swelling associated with conjunctival erythema, throat clearing cough and itchy throat.  She presented to the ER where she was treated with Benadryl and prednisone. Symptoms resolved with a 6 hours. She has since eating chicken without issue, but not the other ingredients  Labs/skin testing: Negative SPT 11/01/21, sIgE 0.48  Interval History: Patient has not been ill, she has not had any accidental exposures to the culprit food.   Recent/Current History: Pulmonary disease: no Cardiac disease: no Respiratory infection: no Rash: no Itch: no Swelling: no Cough: no Shortness of breath: no Runny/stuffy nose: no Itchy eyes: no Beta-blocker use: no  Patient/guardian was informed of the test procedure with verbalized understanding of the risk of anaphylaxis. Consent was signed.   Last antihistamine use: > 3 days ago  Last beta-blocker use: > 3 days ago   Medication List:  Current Outpatient Medications  Medication Sig Dispense Refill   albuterol (VENTOLIN HFA) 108 (90 Base) MCG/ACT inhaler Inhale 2 puffs into the lungs every 6 (six) hours as needed for wheezing or shortness of breath. 8 g 2   bacitracin ointment Apply topically.     cetirizine (ZYRTEC) 10 MG tablet Take 1 tablet (10 mg total) by mouth daily. 30 tablet 5    EPINEPHrine 0.3 mg/0.3 mL IJ SOAJ injection Inject 0.3 mg into the muscle as needed for anaphylaxis. 1 each 1   fluticasone (FLONASE) 50 MCG/ACT nasal spray Place 1 spray into both nostrils daily. 16 g 2   folic acid (FOLVITE) 1 MG tablet Take by mouth.     levETIRAcetam (KEPPRA XR) 500 MG 24 hr tablet Take by mouth.     montelukast (SINGULAIR) 10 MG tablet Take 1 tablet (10 mg total) by mouth at bedtime. 30 tablet 5   zonisamide (ZONEGRAN) 100 MG capsule Take 400 mg by mouth at bedtime.      No current facility-administered medications for this visit.    Allergies: No Known Allergies  I reviewed her past medical history, social history, family history, and environmental history and no significant changes have been reported from her previous visit.   ROS:  ROS negative except noted in HPI  Objective: BP 124/60   Pulse 90   Temp 98.3 F (36.8 C) (Temporal)   Resp 17   Ht 5\' 6"  (1.676 m)   Wt 207 lb 12.8 oz (94.3 kg)   SpO2 99%   BMI 33.54 kg/m  Body mass index is 33.54 kg/m. General Appearance:  Alert, cooperative, no distress, appears stated age  Head:  Normocephalic, without obvious abnormality, atraumatic  Eyes:  Conjunctiva clear, EOM's intact  Nose: Nares normal, hypertrophic turbinates, no visible anterior polyps, and septum midline  Throat: Lips, tongue normal; teeth and gums normal, normal posterior oropharynx and no tonsillar exudate  Neck: Supple, symmetrical  Lungs:  clear to auscultation bilaterally, Respirations unlabored, no coughing  Heart:  regular rate and rhythm and no murmur, Appears well perfused  Extremities: No edema  Skin: Skin color, texture, turgor normal, no rashes or lesions on visualized portions of skin  Neurologic: No gross deficits    Diagnostics: None performed   Previous notes and tests were reviewed. The plan was reviewed with the patient/family, and all questions/concerned were addressed.  Assessment and Plan: Diana Henson is a 31  y.o. female with: Anaphylaxis.  She successfully passed OFC today to soy.  Will have her return on August 4th at 9:00 with Diana Henson for mixed nut butter to clear tree nuts.    Challenge food: soy Challenge as per protocol: Passed Total time: 140 min   Do not eat challenge food for next 24 hours and monitor for hives, swelling, shortness of breath and dizziness. If you see these symptoms, use Benadryl for mild symptoms and epinephrine for more severe symptoms and call 911.  If no adverse symptoms in the next 24 hours, repeat the challenge food the next day and observe for 1 hour. If no adverse symptoms, can eat the food on regular basis.   It was my pleasure to see Diana Henson today and participate in her care. Please feel free to contact me with any questions or concerns.  Sincerely,  Vivianne Master, MD [facility]

## 2021-12-16 IMAGING — CT CT CERVICAL SPINE W/O CM
3 of 4 series · 13 of 33 positions shown, 16 images · non-contrast
Comparison: None.

CLINICAL DATA: Seizure today at [facility] leading to fall striking
forehead on ground.

EXAM:
CT CERVICAL SPINE WITHOUT CONTRAST
TECHNIQUE: Multidetector CT imaging of the cervical spine was performed without
intravenous contrast. Multiplanar CT image reconstructions were also
generated.

[Series 11: sag bone · sagittal · 0.28mm/px · 5 of 89 slices shown, 6 images]
[im 30/89  bone]
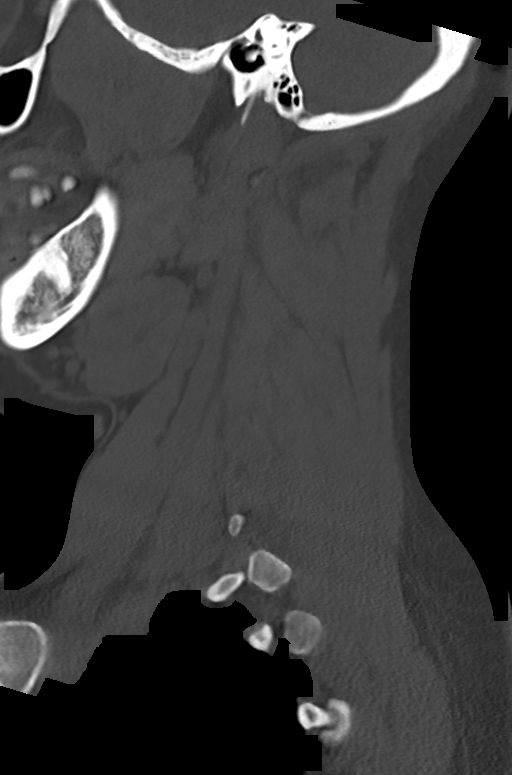
[im 37/89  bone]
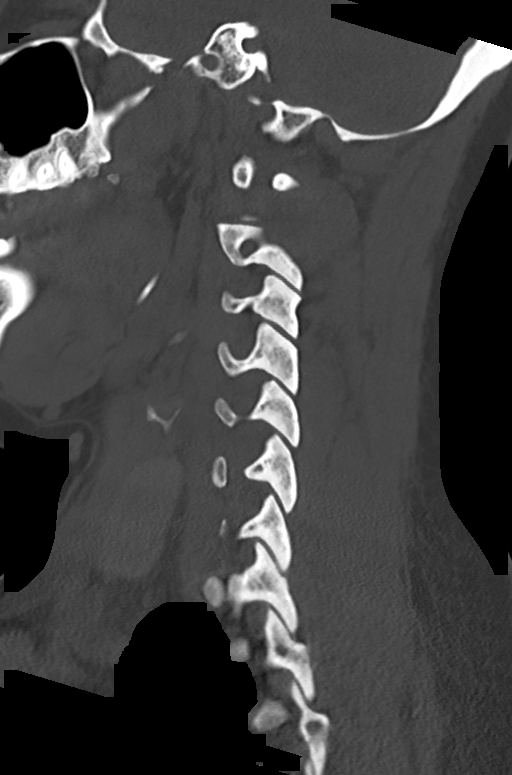
[im 45/89  soft-tissue]
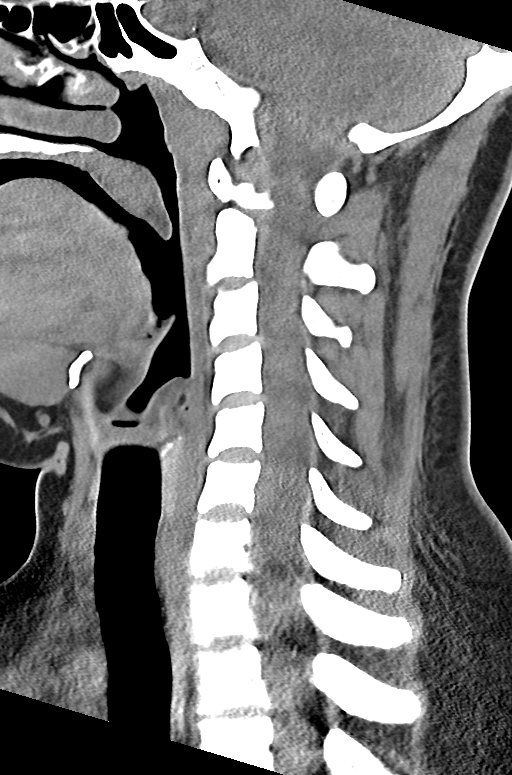
[im 45/89  bone]
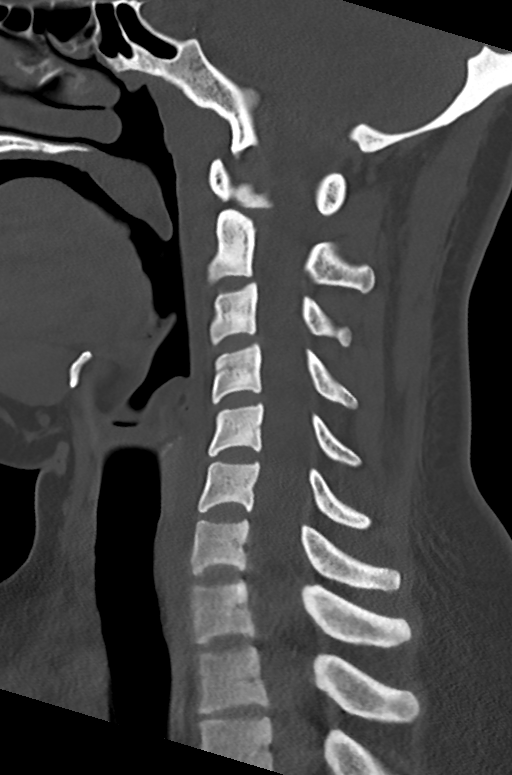
[im 52/89  bone]
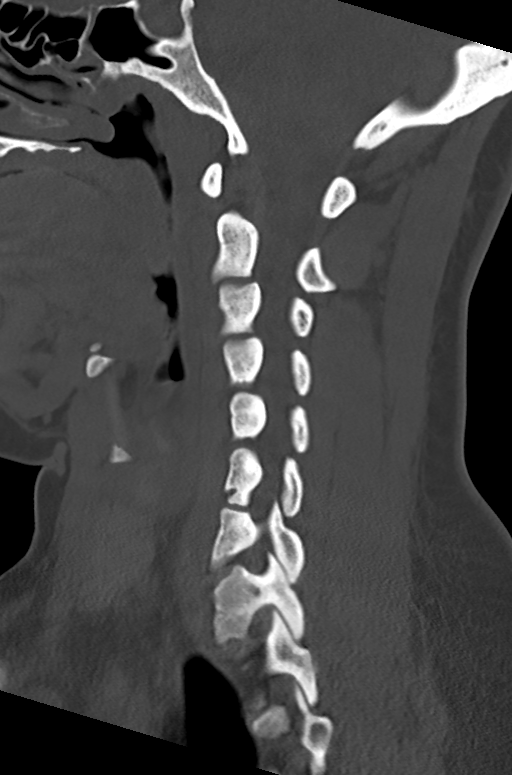
[im 59/89  bone]
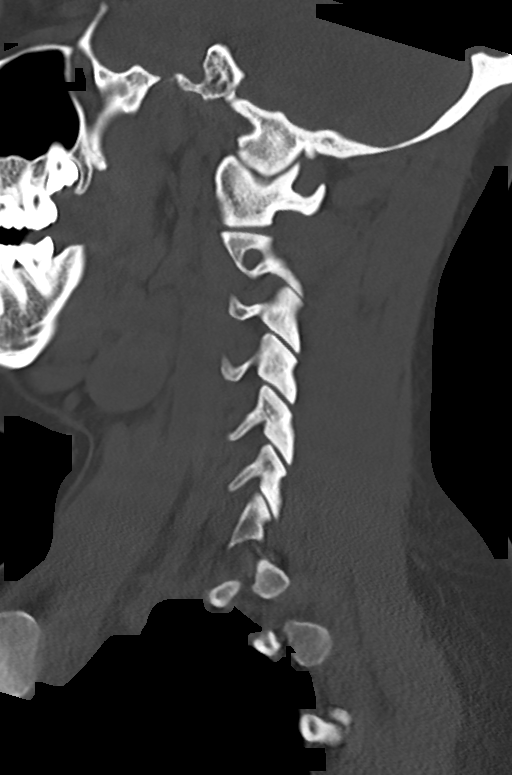

[Series 12: cor bone · coronal · 0.42mm/px · 3 of 64 slices shown]
[im 13/64  bone]
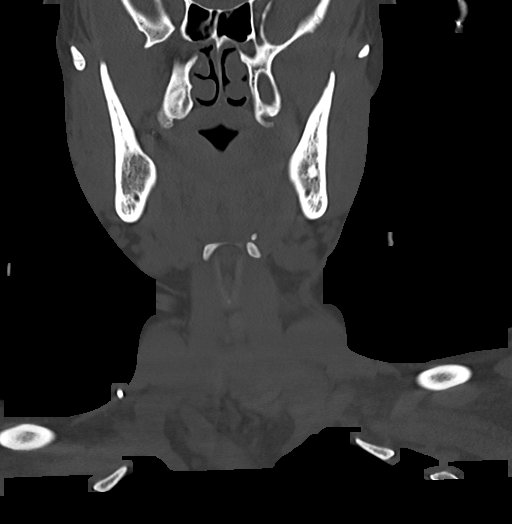
[im 26/64  bone]
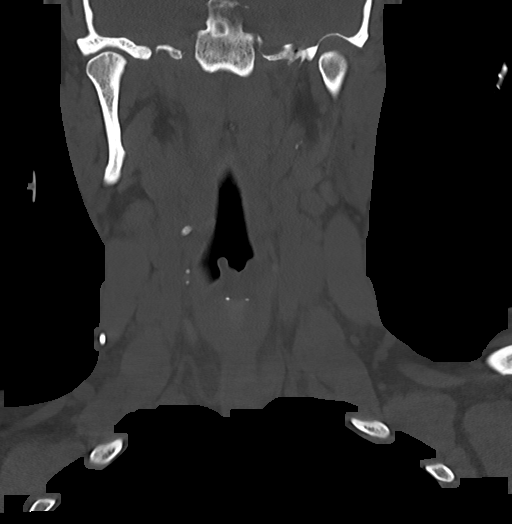
[im 38/64  bone]
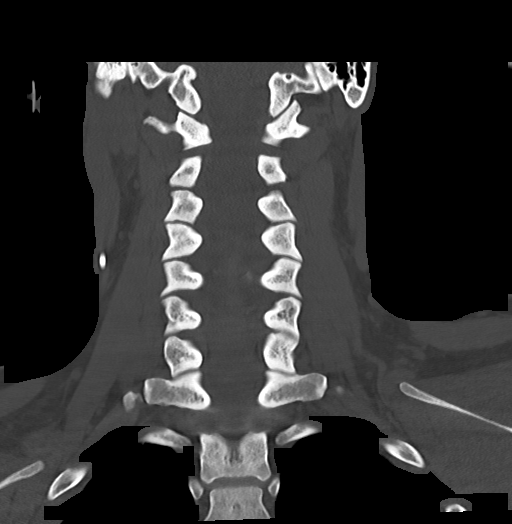

[Series 14: orthogonal axials · axial · 0.21mm/px · z∈[+1176,+1298]mm · 5 of 99 slices shown, 7 images]
[im 17/99  soft-tissue]
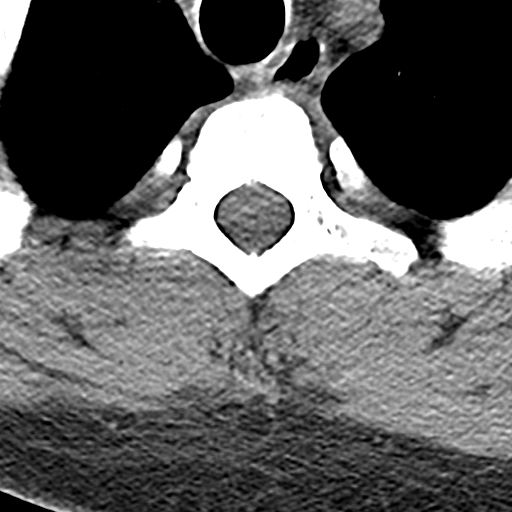
[im 17/99  bone]
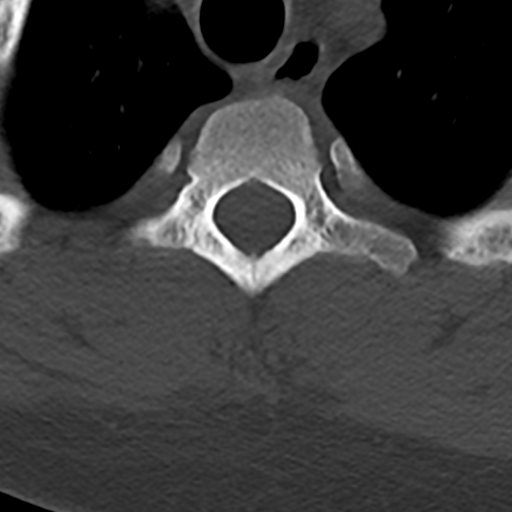
[im 33/99  bone]
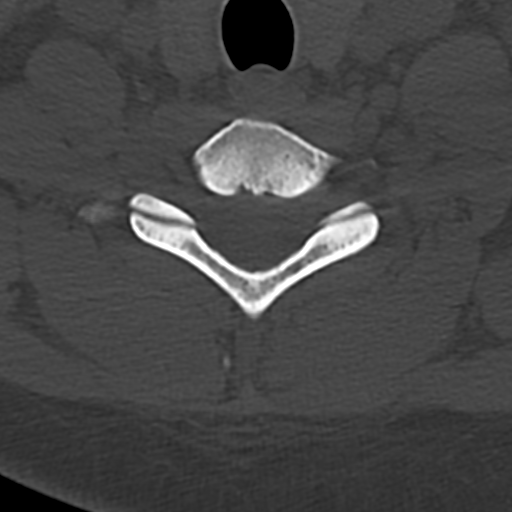
[im 50/99  bone]
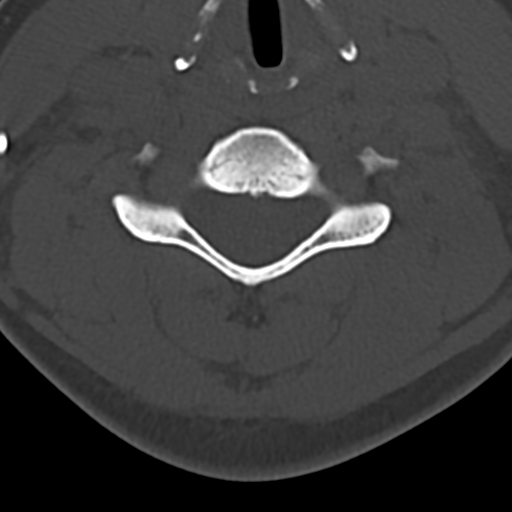
[im 66/99  bone]
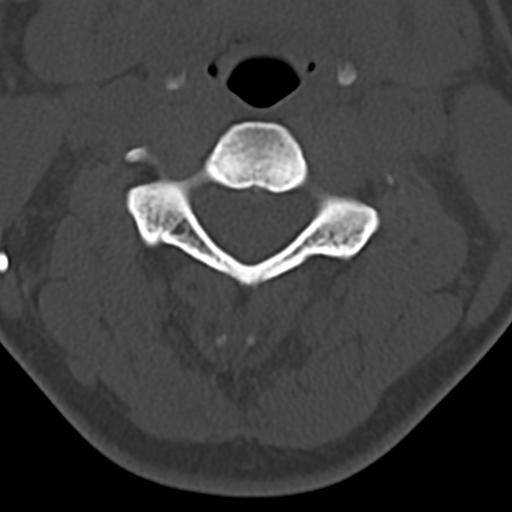
[im 82/99  soft-tissue]
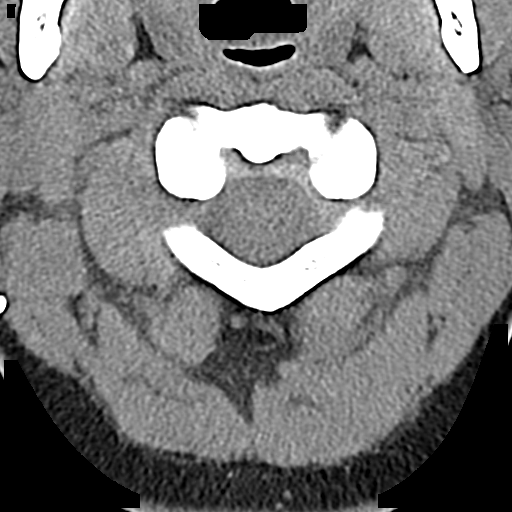
[im 82/99  bone]
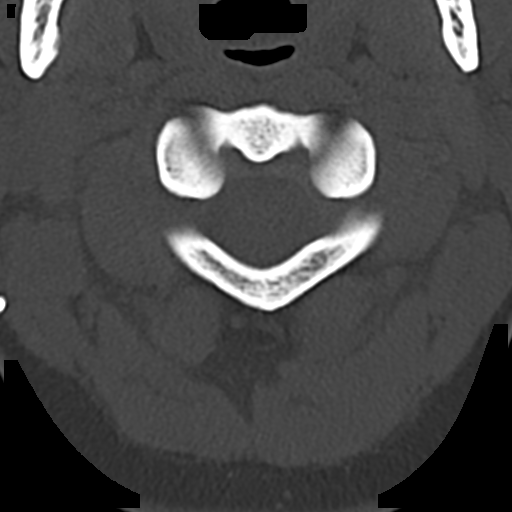

[13 of 33 positions shown; findings below may reference images not displayed]

FINDINGS: Alignment: Normal.

Skull base and vertebrae: No acute fracture. Vertebral body heights
are maintained. The dens and skull base are intact.

Soft tissues and spinal canal: No prevertebral fluid or swelling. No
visible canal hematoma. Ventricular shunt catheter tubing in the
right neck which is intact were visualized.

Disc levels:  Disc spaces are preserved.

Upper chest: Negative.

Other: None.
IMPRESSION: No fracture or subluxation of the cervical spine.

## 2021-12-19 ENCOUNTER — Encounter: Payer: BC Managed Care – PPO | Admitting: Internal Medicine

## 2021-12-23 ENCOUNTER — Encounter: Payer: BC Managed Care – PPO | Admitting: Family

## 2022-09-26 ENCOUNTER — Other Ambulatory Visit: Payer: Self-pay | Admitting: Internal Medicine

## 2023-02-24 ENCOUNTER — Other Ambulatory Visit: Payer: Self-pay

## 2023-02-24 ENCOUNTER — Encounter (HOSPITAL_BASED_OUTPATIENT_CLINIC_OR_DEPARTMENT_OTHER): Payer: Self-pay | Admitting: Emergency Medicine

## 2023-02-24 ENCOUNTER — Emergency Department (HOSPITAL_BASED_OUTPATIENT_CLINIC_OR_DEPARTMENT_OTHER): Payer: BC Managed Care – PPO

## 2023-02-24 ENCOUNTER — Emergency Department (HOSPITAL_BASED_OUTPATIENT_CLINIC_OR_DEPARTMENT_OTHER)
Admission: EM | Admit: 2023-02-24 | Discharge: 2023-02-24 | Disposition: A | Discharge status: Home / Self Care | Payer: BC Managed Care – PPO | Attending: Emergency Medicine | Admitting: Emergency Medicine

## 2023-02-24 DIAGNOSIS — X500XXA Overexertion from strenuous movement or load, initial encounter: Secondary | ICD-10-CM | POA: Diagnosis not present

## 2023-02-24 DIAGNOSIS — M25531 Pain in right wrist: Secondary | ICD-10-CM | POA: Insufficient documentation

## 2023-02-24 NOTE — ED Triage Notes (Signed)
Pt c/o RT wrist pain x 3d; denies injury; swelling noted

## 2023-02-24 NOTE — ED Provider Notes (Signed)
[facility] Provider Note   CSN: [id number] Arrival date & time: 02/24/23  1709     History  Chief Complaint  Patient presents with   Wrist Pain    Diana Henson is a 32 y.o. female.  Patient with noncontributory past medical history presents today with complaints of right wrist injury.  She states that same occurred 3 days ago when she lifted a heavy bag over her shoulder and felt her wrist snap backwards with subsequent pain following.  Pain has been persistent since then.  She is left-handed.  Some swelling noted without fevers or chills. Denies any other joint pain or swelling. Denies any history of similar symptoms previously. She has not tried anything for her pain.  The history is provided by the patient. No language interpreter was used.  Wrist Pain       Home Medications Prior to Admission medications   Medication Sig Start Date End Date Taking? Authorizing Provider  albuterol (VENTOLIN HFA) 108 (90 Base) MCG/ACT inhaler Inhale 2 puffs into the lungs every 6 (six) hours as needed for wheezing or shortness of breath. 11/01/21   Ferol Luz, MD  bacitracin ointment Apply topically. 09/21/19   [provider]  cetirizine (ZYRTEC) 10 MG tablet Take 1 tablet (10 mg total) by mouth daily. 11/01/21   Ferol Luz, MD  EPINEPHrine 0.3 mg/0.3 mL IJ SOAJ injection Inject 0.3 mg into the muscle as needed for anaphylaxis. 11/01/21   Ferol Luz, MD  fluticasone (FLONASE) 50 MCG/ACT nasal spray Place 1 spray into both nostrils daily. 11/01/21   Ferol Luz, MD  levETIRAcetam (KEPPRA XR) 500 MG 24 hr tablet Take by mouth. 12/01/21   [provider]  montelukast (SINGULAIR) 10 MG tablet Take 1 tablet (10 mg total) by mouth at bedtime. 11/01/21   Ferol Luz, MD  zonisamide (ZONEGRAN) 100 MG capsule Take 400 mg by mouth at bedtime.     [provider]      Allergies    Patient has no known  allergies.    Review of Systems   Review of Systems  Musculoskeletal:  Positive for arthralgias.  All other systems reviewed and are negative.   Physical Exam Updated Vital Signs BP 137/86 (BP Location: Left Arm)   Pulse 90   Temp 97.9 F (36.6 C) (Oral)   Resp 16   Ht 5\' 5"  (1.651 m)   Wt 117.9 kg   LMP 02/20/2023   SpO2 100%   BMI 43.27 kg/m  Physical Exam Vitals and nursing note reviewed.  Constitutional:      General: She is not in acute distress.    Appearance: Normal appearance. She is normal weight. She is not ill-appearing, toxic-appearing or diaphoretic.  HENT:     Head: Normocephalic and atraumatic.  Cardiovascular:     Rate and Rhythm: Normal rate.  Pulmonary:     Effort: Pulmonary effort is normal. No respiratory distress.  Musculoskeletal:        General: Normal range of motion.     Cervical back: Normal range of motion.     Comments: TTP to the dorsal and lateral aspect of the right wrist. Mild swelling noted to same without erythema, warmth, fluctuance, or induration. ROM intact with some discomfort.  Compartments soft, distal pulses and sensation intact.  Radial and ulnar pulses intact and 2+.  Good capillary refill.  Skin:    General: Skin is warm and dry.  Neurological:  General: No focal deficit present.     Mental Status: She is alert.  Psychiatric:        Mood and Affect: Mood normal.        Behavior: Behavior normal.     ED Results / Procedures / Treatments   Labs (all labs ordered are listed, but only abnormal results are displayed) Labs Reviewed - No data to display  EKG None  Radiology No results found.  Procedures Procedures    Medications Ordered in ED Medications - No data to display  ED Course/ Medical Decision Making/ A&P                                 Medical Decision Making Amount and/or Complexity of Data Reviewed Radiology: ordered.   Patient presents today with complaints of right wrist injury 3 days ago.  She is afebrile, nontoxic-appearing, and in no acute distress with reassuring vital signs.  Physical exam reveals tenderness to palpation at the dorsal ulnar aspect of the right wrist.  Mild swelling noted as well.  No erythema, warmth, fluctuance, or induration.  Good distal pulses and sensation.  No obvious deformity.  Compartments are soft.  X-ray imaging obtained of the wrist which has resulted and reveals  Mild soft tissue edema without osseous abnormality.  I personally reviewed and interpreted this imaging and agree with radiology interpretation.  Patient given a wrist brace for support and referral to orthopedics for follow-up.  No signs or symptoms to suggest infectious etiology.  Suspect she has a mild wrist sprain.  Discussed same with patient who expressed understanding.  Recommend RICE and supportive measures with Tylenol/ibuprofen. Evaluation and diagnostic testing in the emergency department does not suggest an emergent condition requiring admission or immediate intervention beyond what has been performed at this time.  Plan for discharge with close PCP follow-up.  Patient is understanding and amenable with plan, educated on red flag symptoms that would prompt immediate return.  Patient discharged in stable condition.  Final Clinical Impression(s) / ED Diagnoses Final diagnoses:  Acute pain of right wrist    Rx / DC Orders ED Discharge Orders     None     An After Visit Summary was printed and given to the patient.     Vear Clock 02/24/23 1857    Terrilee Files, MD 02/25/23 1019

## 2023-02-24 NOTE — Discharge Instructions (Signed)
Your wrist x-ray today was negative for acute findings.  I suspect that you has sustained a wrist sprain from your injury.  I have given you a brace to wear for support and recommend that you rest, ice, compress, and elevate your wrist and take Tylenol/ibuprofen as needed for pain.  I have also given you a referral to orthopedics with number to call to schedule an appointment for follow-up.  Please call at your earliest convenience.  Return if development of any new or worsening symptoms.
